# Patient Record
Sex: Male | Born: 1940 | Race: White | Hispanic: No | Marital: Married | Smoking: Former smoker
Health system: Southern US, Community
[De-identification: ages and names within clinical notes are randomized; demographics above are authoritative.]

## PROBLEM LIST (undated history)

## (undated) DIAGNOSIS — I4891 Unspecified atrial fibrillation: Principal | ICD-10-CM

## (undated) DIAGNOSIS — F0390 Unspecified dementia without behavioral disturbance: Secondary | ICD-10-CM

## (undated) DIAGNOSIS — G3184 Mild cognitive impairment, so stated: Secondary | ICD-10-CM

## (undated) DIAGNOSIS — E7800 Pure hypercholesterolemia, unspecified: Secondary | ICD-10-CM

## (undated) DIAGNOSIS — I10 Essential (primary) hypertension: Secondary | ICD-10-CM

## (undated) DIAGNOSIS — R413 Other amnesia: Secondary | ICD-10-CM

## (undated) HISTORY — DX: Essential (primary) hypertension: I10

## (undated) HISTORY — DX: Pure hypercholesterolemia, unspecified: E78.00

## (undated) HISTORY — PX: KNEE SURGERY: SHX244

## (undated) HISTORY — DX: Unspecified dementia without behavioral disturbance: F03.90

## (undated) HISTORY — DX: Other amnesia: R41.3

## (undated) HISTORY — DX: Mild cognitive impairment, so stated: G31.84

## (undated) HISTORY — DX: Unspecified atrial fibrillation: I48.91

---

## 2003-03-12 ENCOUNTER — Ambulatory Visit (HOSPITAL_BASED_OUTPATIENT_CLINIC_OR_DEPARTMENT_OTHER): Admission: RE | Admit: 2003-03-12 | Discharge: 2003-03-12 | Payer: Self-pay | Admitting: Orthopedic Surgery

## 2003-03-12 ENCOUNTER — Ambulatory Visit (HOSPITAL_COMMUNITY): Admission: RE | Admit: 2003-03-12 | Discharge: 2003-03-12 | Payer: Self-pay | Admitting: Orthopedic Surgery

## 2005-05-04 ENCOUNTER — Inpatient Hospital Stay (HOSPITAL_COMMUNITY): Admission: RE | Admit: 2005-05-04 | Discharge: 2005-05-06 | Payer: Self-pay | Admitting: Orthopedic Surgery

## 2012-10-18 ENCOUNTER — Telehealth: Payer: Self-pay

## 2012-10-18 DIAGNOSIS — R413 Other amnesia: Secondary | ICD-10-CM

## 2012-10-18 MED ORDER — MEMANTINE HCL ER 28 MG PO CP24
28.0000 mg | ORAL_CAPSULE | Freq: Every day | ORAL | Status: DC
Start: 2012-10-18 — End: 2012-12-07

## 2012-10-18 NOTE — Telephone Encounter (Signed)
Darren Morgan called and left a message saying they got a letter from their insurance stating [name] 10mg  would no longer be available.  They would like to change to the 28mg  XR version once daily.  Rx has been updated (we have been advised 10mg  BID is equal to 28mg  XR daily).  Forwarding to provider for signature.

## 2012-11-02 ENCOUNTER — Other Ambulatory Visit: Payer: Self-pay | Admitting: Neurology

## 2012-12-06 ENCOUNTER — Encounter: Payer: Self-pay | Admitting: Neurology

## 2012-12-07 ENCOUNTER — Ambulatory Visit (INDEPENDENT_AMBULATORY_CARE_PROVIDER_SITE_OTHER): Payer: Medicare Other | Admitting: Neurology

## 2012-12-07 ENCOUNTER — Encounter: Payer: Self-pay | Admitting: Neurology

## 2012-12-07 VITALS — BP 160/89 | HR 58 | Temp 97.9°F | Resp 15 | Ht 70.5 in | Wt 225.0 lb

## 2012-12-07 DIAGNOSIS — F0390 Unspecified dementia without behavioral disturbance: Secondary | ICD-10-CM

## 2012-12-07 DIAGNOSIS — R413 Other amnesia: Secondary | ICD-10-CM

## 2012-12-07 HISTORY — DX: Unspecified dementia, unspecified severity, without behavioral disturbance, psychotic disturbance, mood disturbance, and anxiety: F03.90

## 2012-12-07 MED ORDER — DONEPEZIL HCL 10 MG PO TABS: 10.0000 mg | ORAL_TABLET | Freq: Every day | ORAL | Status: DC

## 2012-12-07 MED ORDER — MEMANTINE HCL ER 28 MG PO CP24: 28.0000 mg | ORAL_CAPSULE | Freq: Every day | ORAL | Status: DC

## 2012-12-07 NOTE — Progress Notes (Addendum)
[facility]  Provider:  Dr Darren Morgan Referring Provider: No ref. provider found Primary Care Physician:  Darren Saupe, MD  Chief Complaint  Patient presents with  . Follow-up    memory loss, rm 10,MMSE:19/30,AFT:15    HPI:  Darren Morgan is a 72 y.o. male here as a referral from Dr. Cornell Morgan, here in a revisit for  memory loss, presumed  early dementia.   [name] , a 72 year old Caucasian right-handed gentleman presents today for revisit in the  presence of his wife. The patient scored on an MMSE  Test in March 2013 21/30 points , but was unable to spell backwards,  he did the calculations.  AFT was 18 points at the time and clock drawing 4/ 4 points in 2013 he changed to [name] 10 mg once a day and had in addition taken [name], now the  MMSE was 24 of 30.  The patient continues to use word finding games, suduko and reads books at home for brain  exercise but he likes mainly to watch TV. He does walk for exercise.  He has a push mower to cut the grass , he is not computer literate at this point and has not made any attempts to play with luminosity or other brain stimulating games.  His fall risk assessment is 2 point  today he MMSE scored only  19/30 points , animal fluency is 15 points, clock was not performed ,  the patient  lost points in the backwards spelling and in the word recall ( missed 2 of 3 words) ,he could not do the calculations but couldn't spell  either. Was unsure about the day of the week ,complete date, unsure even  of the season.  The geriatric depression scale was endorsed at only 1 point.    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   [name] in 2005 under Dr Darren Morgan  was negative for vascular dementia.  His wife reports that they have been a repeat imaging study, at nighttime I altered medical record system nor Epic has any evidence of these. It was on and 5 that was very mild  atrophy described which may mean by the house progressed. I would like to repeat an [name] without contrast. Labs were unremarkable, last drawn here at 09-10-11 normal TSH, homocysteine, RPR negative, vitamin B12 , normal blood cell count. He has since had laboratory testing in May of 2014 with Dr. Jillyn Hidden.      Review of Systems: no hallucinations , no sleep disturbance, no change in appetite. Out of a complete 14 system review, the patient complains of only the following symptoms, and all other reviewed systems are negative.  This patient has been followed in our office since November 2004 by   Dr Darren Morgan ,  in 2013 [name] came for visit that  his wife  initiated after she became concerned about hisr rapid cognitive decline.  The couple had retired, spend  more time together,and daily activities and observations  were more shared than before.  Her husband continued to repeat himself in conversation and seemed to have undergone some personality change.  At the time he left tremor or cogwheeling , but he had a decreased facial expression and also was not as fluent as his work output than he used to be. He had no weakness ,no falls, no spasticity.   Denied any visual difficulties he has been  treated for hypertension and hypercholesterolemia by his primary care physician and was placed on sertraline under the assumption that he is depressed in 2010, now  discontinued.  His Mini-Mental Status Examinations  were performed serially and show a decline since March 2013 .  History   Social History  . Marital Status: Single    Spouse Name: N/A    Number of Children: 1  . Years of Education: N/A   Occupational History  . RETIRED    Social History Main Topics  . Smoking status: Former Games developer  . Smokeless tobacco: Not on file  . Alcohol Use: No  . Drug Use: No  . Sexually Active: Not on file   Other Topics Concern  . Not on file   Social History Narrative   [name], right handed .  retired. Lives at home with his wife of 32 years, Darren Morgan. They have one grown child. Has three sodas a day. Denies alcohol, tobacco and drug use. pt consumes tea ,diet cola once a day.    Family History  Problem Relation Age of Onset  . Alzheimer's disease Mother   . Alzheimer's disease Maternal Aunt     Past Medical History  Diagnosis Date  . Memory loss   . Hypertension   . High cholesterol   . Dementia   . Dementia 12/07/2012    Past Surgical History  Procedure Laterality Date  . Knee surgery Right     KNEE CAP REPLACED    Current Outpatient Prescriptions  Medication Sig Dispense Refill  . aspirin 81 MG tablet Take 81 mg by mouth daily.      Darren Morgan donepezil ([name]) 10 MG tablet Take 1 tablet (10 mg total) by mouth daily.  90 tablet  3  . lisinopril (PRINIVIL,ZESTRIL) 10 MG tablet Take 10 mg by mouth daily.      . [name] ([name] XR) 28 MG CP24 Take 28 mg by mouth daily.  90 capsule  3  . Multiple Vitamins-Minerals (CENTRUM SILVER PO) Take by mouth daily.       No current facility-administered medications for this visit.    Allergies as of 12/07/2012  . (No Known Allergies)    Vitals: BP 160/89  Pulse 58  Temp(Src) 97.9 F (36.6 C) (Oral)  Resp 15  Ht 5' 10.5" (1.791 m)  Wt 225 lb (102.059 kg)  BMI 31.82 kg/m2 Last Weight:  Wt Readings from Last 1 Encounters:  12/07/12 225 lb (102.059 kg)   Last Height:   Ht Readings from Last 1 Encounters:  12/07/12 5' 10.5" (1.791 m)     Physical exam:  General: The patient is awake, alert and appears not in acute distress. The patient is well groomed. Head: Normocephalic, atraumatic. Neck is supple. Mallampati 3 , neck circumference 14.5 inches . Cardiovascular:  irregular rate and rhythm 54 =58, without  murmurs or carotid bruit, and without distended neck veins. Respiratory: Lungs are clear to auscultation. Skin:  Without evidence of edema, or rash Trunk: BMI is elevated , patient  has normal  posture.  Neurologic exam : The patient is awake and alert, oriented to place and time.  Memory subjective  described as " intact' he is very passive and docile, he only answers closed questions.  There is a normal attention span & concentration ability. Speech is non fluent without dysarthria, mild  Dysphonia. Mood and affect : he appears not to realize that he is the patient.   Cranial nerves: Pupils are equal and  briskly reactive to light. Funduscopic exam without  evidence of pallor or edema. Extraocular movements  in vertical and horizontal planes intact and without nystagmus. Visual fields by finger perimetry are intact. Hearing to finger rub intact.  Facial sensation intact to fine touch. Facial motor strength is symmetric and tongue and uvula move midline.  Motor exam: Normal tone and normal muscle bulk and symmetric normal strength in all extremities.  Sensory:  Fine touch, pinprick and vibration were tested in all extremities. Proprioception is tested in the upper extremities only. This was  normal.  Coordination: Rapid alternating movements in the fingers/hands is tested and normal. Finger-to-nose maneuver tested and normal without evidence of ataxia, dysmetria or tremor.  Gait and station: Patient walks without assistive device , he rises with crossing  his arms in front. Up and go . Strength within normal limits. Stance is stable and normal. Tandem gait is impossible , he is wider based for stability, attributed to his "bad' knee.  Unfragmented gait over 50 yards.   Romberg testing is normal.  Deep tendon reflexes: in the  upper and lower extremities are symmetric and intact. Babinski maneuver downgoing.   Assessment:  After physical and neurologic examination, review of laboratory studies, imaging, neurophysiology testing and pre-existing records, assessment will be reviewed on the problem list.    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 Driving restrictions were dicussed again, his wife will  drive for him and occ. on Saturday/ Sunday lets him drive to church , grocery store, but is present in the car .  She reported that he " forgot the driving limitations"  and we needed to discuss them again in detail. It may be easier for him just not to drive at all.   Labs from dr Jillyn Hidden, please.  [name] repeat  In the next 6 month for RV - brain without contrast .  EEG repeat in the next 6 month , comparing to normal study from March 2013.

## 2012-12-07 NOTE — Patient Instructions (Signed)
Driving and Equipment Restrictions °Some medical problems make it dangerous to drive, ride a bike, or use machines. Some of these problems are: °· A hard blow to the head (concussion). °· Passing out (fainting). °· Twitching and shaking (seizures). °· Low blood sugar. °· Taking medicine to help you relax (sedatives). °· Taking pain medicines. °· Wearing an eye patch. °· Wearing splints. This can make it hard to use parts of your body that you need to drive safely. °HOME CARE  °· Do not drive until your doctor says it is okay. °· Do not use machines until your doctor says it is okay. °You may need a form signed by your doctor (medical release) before you can drive again. You may also need this form before you do other tasks where you need to be fully alert. °MAKE SURE YOU: °· Understand these instructions. °· Will watch your condition. °· Will get help right away if you are not doing well or get worse. °Document Released: 07/09/2004 Document Revised: 08/24/2011 Document Reviewed: 10/09/2009 °ExitCare® Patient Information ©2014 [employer]. ° °

## 2012-12-07 NOTE — Addendum Note (Signed)
Addended by: Melvyn Novas on: 12/07/2012 09:56 AM   Modules accepted: Orders

## 2013-05-01 ENCOUNTER — Ambulatory Visit (INDEPENDENT_AMBULATORY_CARE_PROVIDER_SITE_OTHER): Payer: Medicare Other

## 2013-05-01 DIAGNOSIS — R413 Other amnesia: Secondary | ICD-10-CM

## 2013-05-01 NOTE — Procedures (Signed)
    History:  Darren Morgan is a 72 year old gentleman with a history of a progressive memory disturbance. The patient is having some word finding issues, change in functional level. The patient being evaluated for the above.  This is a routine EEG. No skull defects are noted. Medications include [name], lisinopril, [name], and multivitamins.   EEG classification: Normal awake  Description of the recording: The background rhythms of this recording consists of a fairly well modulated medium amplitude alpha rhythm of 8 Hz that is reactive to eye opening and closure. As the record progresses, the patient appears to remain in the waking state throughout the recording. Photic stimulation was performed, resulting in a bilateral and symmetric photic driving response. Hyperventilation was also performed, resulting in a minimal buildup of the background rhythm activities without significant slowing seen. At no time during the recording does there appear to be evidence of spike or spike wave discharges or evidence of focal slowing. EKG monitor shows no evidence of cardiac rhythm abnormalities with a heart rate of 56.  Impression: This is a normal EEG recording in the waking state. No evidence of ictal or interictal discharges are seen.

## 2013-06-13 ENCOUNTER — Ambulatory Visit: Payer: Medicare Other | Admitting: Neurology

## 2013-06-20 ENCOUNTER — Ambulatory Visit (INDEPENDENT_AMBULATORY_CARE_PROVIDER_SITE_OTHER): Payer: Medicare Other | Admitting: Neurology

## 2013-06-20 ENCOUNTER — Encounter (INDEPENDENT_AMBULATORY_CARE_PROVIDER_SITE_OTHER): Payer: Self-pay

## 2013-06-20 ENCOUNTER — Encounter: Payer: Self-pay | Admitting: Neurology

## 2013-06-20 VITALS — BP 169/71 | HR 60 | Ht 71.0 in | Wt 219.0 lb

## 2013-06-20 DIAGNOSIS — F0390 Unspecified dementia without behavioral disturbance: Secondary | ICD-10-CM

## 2013-06-20 DIAGNOSIS — G309 Alzheimer's disease, unspecified: Principal | ICD-10-CM

## 2013-06-20 DIAGNOSIS — G3184 Mild cognitive impairment, so stated: Secondary | ICD-10-CM

## 2013-06-20 DIAGNOSIS — F0280 Dementia in other diseases classified elsewhere without behavioral disturbance: Secondary | ICD-10-CM

## 2013-06-20 DIAGNOSIS — R413 Other amnesia: Secondary | ICD-10-CM

## 2013-06-20 HISTORY — DX: Mild cognitive impairment of uncertain or unknown etiology: G31.84

## 2013-06-20 MED ORDER — MEMANTINE HCL ER 28 MG PO CP24: 28.0000 mg | ORAL_CAPSULE | Freq: Every day | ORAL | Status: DC

## 2013-06-20 MED ORDER — SILDENAFIL CITRATE 25 MG PO TABS: 25.0000 mg | ORAL_TABLET | Freq: Every day | ORAL | Status: DC | PRN

## 2013-06-20 MED ORDER — DONEPEZIL HCL 23 MG PO TABS: 23.0000 mg | ORAL_TABLET | Freq: Every day | ORAL | Status: DC

## 2013-06-20 NOTE — Progress Notes (Signed)
Darren Morgan  Provider:  Dr Vickey Morgan Referring Provider: Cain Saupe, MD Primary Care Physician:  Darren Saupe, MD  Chief Complaint  Patient presents with  . Follow-up    6 mo, Rm 11    HPI:  Darren Morgan is a 73 y.o. male here as a referral from Darren. Cornell Barman, here in a revisit for memory loss, presumed  early dementia.    Interval history : Darren Morgan , a 73 year old Caucasian right-handed gentleman presents today for revisit to test memory in the presence of his wife. Darren Morgan has been referred for a memory evaluation and be repeated MMSE test today. His last visit  in 2012 showed 19 points on MMSE , score and 15 points on the animal fluency test.  In March 2013 he had an MMSE of  21 of 30 points and in his last visit from June 2014 ,  MMSE scored 24/30 points.  Today's MMSE shows 20 of 30 points- a stable finding. [name] is most of his points in the recall of 3 words but was able to spell backwards 3 of the 5 letters - an attention and calculation alternative was not given.  The clock drawing test was performed with 4/4 points and the animal fluency test at 8 points.   The patient is usually quiet and not talkative, has never been. There is no reported change in personaliy or character. He is not longer driving.  [name] 2 points, He is willing to change to aricept 23 mg after the 10 mg dose is completed. The patient continues to use word finding games, suduko and reads books at home for brain exercise,  but he likes mainly to watch TV.  He does walk daily for exercise, has to be remainded.  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   [name] in 2005 under Darren Thad Ranger  was negative for vascular dementia. Darren Morgan husband continued to repeat  himself in conversation, repeatedly asked the same question.  His mother had dementia and seemed to have undergone some personality change.  At the time the patient developed a decreased facial expression and also was not as fluent as his work output than he used to be.  He had no weakness ,no falls, no spasticity. And today no tremor. No movement restriction. No EOM abnormality.  Fall risk remains at 2 points. Erectile dysfunction has become evident to his wife, but no other dysautonomic symptoms wer endorsed , such as dizziness, neuropathy, abnormal sweating or Orthostasis.    Last Visit note:   The patient scored on an MMSE - Test in March 2013 21/30 points , but was unable to spell backwards, he did the calculations.  AFT was 18 points at the time and clock drawing 4/ 4 points. He changed to [name] 10 mg once a day and had in addition taken [name], now the  MMSE was 24 of 30. His fall risk assessment is 2 point  MMSE scored only  19/30 points , animal fluency is 15 points, clock was not performed ,  the patient  lost points in the backwards spelling and in the word recall ( missed 2 of 3 words) ,he could not do the calculations but couldn't spell .His  wife reports that they have been a repeat imaging study, at nighttime I altered medical record system nor Epic has any evidence of these. It was on and 5 that was very mild atrophy described which may mean by the house progressed. I would like to repeat an [name] without contrast. Labs were unremarkable, last drawn here at 09-10-11 normal TSH, homocysteine, RPR negative, vitamin B12 , normal blood cell count. He has since had laboratory testing in May of 2014 with Darren. Jillyn Hidden.      Review of Systems: no hallucinations , no sleep disturbance, no change in appetite. Out of a complete 14 system review, the patient complains of only the following symptoms, and all other reviewed systems are negative.  This patient has been followed in our office since  November 2004 by Darren Thad Ranger ,  in 2013 Mr. Savannah came for visit that  his wife  initiated after she became concerned about hisr rapid cognitive decline.  The couple had retired, spend  more time together,and daily activities and observations  were more shared than before.   Denied any visual difficulties. No REM BD.   He has been treated for hypertension and hypercholesterolemia by his primary care physician and was placed on sertraline under the assumption that he is depressed in 2010, now discontinued.  His Mini-Mental Status Examinations  were performed serially and show a decline since March 2013 .  History   Social History  . Marital Status: Married    Spouse Name: joan    Number of Children: 1  . Years of Education: college   Occupational History  . RETIRED    Social History Main Topics  . Smoking status: Former Games developer  . Smokeless tobacco: Not on file  . Alcohol Use: No  . Drug Use: No  . Sexual Activity: Not on file   Other Topics Concern  . Not on file   Social History Narrative   [name], right handed . retired. Lives at home with his wife of 32 years, Darren Morgan. They have one grown child. Has three sodas a day. Denies alcohol, tobacco and drug use. pt consumes tea ,diet cola once a day.    Family History  Problem Relation Age of Onset  . Alzheimer's disease Mother   . Alzheimer's disease Maternal Aunt     Past Medical History  Diagnosis Date  . Memory loss   . Hypertension   . High cholesterol   . Dementia   . Dementia 12/07/2012    Past Surgical History  Procedure Laterality Date  . Knee surgery Right     KNEE CAP REPLACED    Current Outpatient Prescriptions  Medication Sig Dispense Refill  . aspirin 81 MG tablet Take 81 mg by mouth daily.      Marland Kitchen donepezil ([name]) 10 MG tablet Take 1 tablet (10 mg total) by mouth daily.  90 tablet  3  . lisinopril (PRINIVIL,ZESTRIL) 20 MG tablet Take 20 mg by mouth daily.      . [name] ([name] XR)  28 MG CP24 Take 28 mg by mouth daily.  90 capsule  3  . Multiple Vitamins-Minerals (CENTRUM SILVER PO) Take by mouth daily.       No current facility-administered medications for this visit.    Allergies as of 06/20/2013  . (No Known Allergies)    Vitals: BP 169/71  Pulse 60  Ht 5\' 11"  (1.803 m)  Wt 219 lb (99.338 kg)  BMI 30.56 kg/m2 Last Weight:  Wt Readings from Last 1 Encounters:  06/20/13 219 lb (99.338 kg)   Last Height:   Ht Readings from Last 1 Encounters:  06/20/13 5\' 11"  (1.803 m)     Physical exam:  General: The patient is awake, alert and appears not in acute distress. The patient is well groomed. Head: Normocephalic, atraumatic. Neck is supple. Mallampati 3 , neck circumference 14.5 inches . Cardiovascular:  irregular rate and rhythm 54 =58, without  murmurs or carotid bruit, and without distended neck veins. Respiratory: Lungs are clear to auscultation. Skin:  Without evidence of edema, or rash Trunk: BMI is elevated , patient  has normal posture.  Neurologic exam : The patient is awake and alert, oriented to place and time.   Memory subjective  described as " intact' he is very passive and docile, he only answers closed questions. His MMSE was 20 out of 30, declined slowly .  Marland Kitchen. Speech is non fluent, sparse - without dysarthria, mild dysphonia. Mood and affect : he appears not to realize that he is the patient.   Cranial nerves: Pupils are equal and briskly reactive to light. Funduscopic exam without  evidence of pallor or edema. Extraocular movements  in vertical and horizontal planes intact and without nystagmus. Visual fields by finger perimetry are intact. Hearing to finger rub intact. Facial sensation intact to fine touch. Facial motor strength is symmetric and tongue and uvula move midline.  Motor exam: Normal tone and normal muscle bulk and symmetric normal strength in all extremities.  Sensory:  Fine touch, pinprick and vibration were  normal.  Coordination: Rapid alternating movements in the fingers/hands is slowed , Finger-to-nose maneuver tested and normal without evidence of ataxia, dysmetria or tremor.  Gait and station: Patient walks without assistive device , he rises without bracing himself.  Strength within normal limits. Stance is stable and normal. Tandem gait is impossible , he is wider based for stability, attributed to his "bad' knee.   Unfragmented gait over 50 yards.  Romberg testing is negative .  Deep tendon reflexes: in the  upper and lower extremities are attenuated - Babinski maneuver downgoing.   Assessment:  After physical and neurologic examination, review of laboratory studies, imaging, neurophysiology testing and pre-existing records, assessment will be reviewed on the problem list. Slowly progressive cognitive decline, rather stable on medications.. Increase aricept to 23 mg daily , keep on [name].   Dementia, early  Stage  In 2011 , now progressed to  moderate.  Driving restrictions were dicussed again, his wife will  drive for him.   [name] repeat ordered . EEG discussed- normal EEG ! RV in the next 6 month, with NP, comparing to MMSE .

## 2013-06-20 NOTE — Patient Instructions (Signed)
Dementia Dementia is a word that is used to describe problems with the brain and how it works. People with dementia have memory loss. They may also have problems with thinking, speaking, or solving problems. It can affect how they act around people, how they do their job, their mood, and their personality. These changes may not show up for a long time. Family or friends may not notice problems in the early part of this disease. HOME CARE The following tips are for the person living with, or caring for, the person with dementia. Make the home safe.  Remove locks on bathroom doors.  Use childproof locks on cabinets where alcohol, cleaning supplies, or chemicals are stored.  Put outlet covers in electrical outlets.  Put in childproof locks to keep doors and windows safe.  Remove stove knobs, or put in safety knobs that shut off on their own.  Lower the temperature on water heaters.  Label medicines. Lock them in a safe place.  Keep knives, lighters, matches, power tools, and guns out of reach or in a safe place.  Remove objects that might break or can hurt the person.  Make sure lighting is good inside and outside.  Put in grab bars if needed.  Use a device that detects falls or other needs for help. Lessen confusion.  Keep familiar objects and people around.  Use night lights or low lit (dim) lights at night.  Label objects or areas.  Use reminders, notes, or directions for daily activities or tasks.  Keep a simple routine that is the same for waking, meals, bathing, dressing, and bedtime.  Create a calm and quiet home.  Put up clocks and calendars.  Keep emergency numbers and the home address near all phones.  Help show the different times of day. Open the curtains during the day to let light in. Speak clearly and directly.  Choose simple words and short sentences.  Use a gentle, calm voice.  Do not interrupt.  If the person has a hard time finding a word to  use, give them the word or thought.  Ask 1 question at a time. Give enough time for the person to answer. Repeat the question if the person does not answer. Do things that lessen restlessness.  Provide a comfortable bed.  Have the same bedtime routine every night.  Have a regular walking and activity schedule.  Lessen naps during the day.  Do not let the person drink a lot of caffeine.  Go to events that are not overwhelming. Eat well and drink fluids.  Lessen distractions during meal times and snacks.  Avoid foods that are too hot or too cold.  Watch how the person chews and swallows. This is to make sure they do not choke. Other  Keep all vision, hearing, dental, and medical visits with the doctor.  Only give medicines as told by the doctor.  Watch the person's driving ability. Do not let the person drive if he or she cannot drive safely.  Use a program that helps find a person if they become missing. You may need to register with this program. GET HELP RIGHT AWAY IF:   A fever of 102 F (38.9 C) develops.  Confusion develops or gets worse.  Sleepiness develops or gets worse.  Staying awake is hard to do.  New behavior problems start like mood swings, aggression, and seeing things that are not there.  Problems with balance, speech, or falling develop.  Problems swallowing develop.  Any   problems of another sickness develop. MAKE SURE YOU:  Understand these instructions.  Will watch his or her condition.  Will get help right away if he or she is not doing well or gets worse. Document Released: 05/14/2008 Document Revised: 08/24/2011 Document Reviewed: 10/27/2010 [employer] Patient Information 2014 [employer].  

## 2013-07-13 ENCOUNTER — Ambulatory Visit
Admission: RE | Admit: 2013-07-13 | Discharge: 2013-07-13 | Disposition: A | Discharge status: Home / Self Care | Payer: Medicare Other | Source: Ambulatory Visit | Attending: Neurology | Admitting: Neurology

## 2013-07-13 DIAGNOSIS — F0390 Unspecified dementia without behavioral disturbance: Secondary | ICD-10-CM

## 2013-09-01 ENCOUNTER — Emergency Department (HOSPITAL_COMMUNITY): Payer: Medicare Other

## 2013-09-01 ENCOUNTER — Emergency Department (HOSPITAL_COMMUNITY)
Admission: EM | Admit: 2013-09-01 | Discharge: 2013-09-01 | Disposition: A | Discharge status: Home / Self Care | Payer: Medicare Other | Attending: Emergency Medicine | Admitting: Emergency Medicine

## 2013-09-01 ENCOUNTER — Encounter (HOSPITAL_COMMUNITY): Payer: Self-pay | Admitting: Emergency Medicine

## 2013-09-01 DIAGNOSIS — Z79899 Other long term (current) drug therapy: Secondary | ICD-10-CM | POA: Insufficient documentation

## 2013-09-01 DIAGNOSIS — R112 Nausea with vomiting, unspecified: Secondary | ICD-10-CM | POA: Insufficient documentation

## 2013-09-01 DIAGNOSIS — F0390 Unspecified dementia without behavioral disturbance: Secondary | ICD-10-CM | POA: Insufficient documentation

## 2013-09-01 DIAGNOSIS — E876 Hypokalemia: Secondary | ICD-10-CM | POA: Insufficient documentation

## 2013-09-01 DIAGNOSIS — Z7982 Long term (current) use of aspirin: Secondary | ICD-10-CM | POA: Insufficient documentation

## 2013-09-01 DIAGNOSIS — Z87891 Personal history of nicotine dependence: Secondary | ICD-10-CM | POA: Insufficient documentation

## 2013-09-01 DIAGNOSIS — R61 Generalized hyperhidrosis: Secondary | ICD-10-CM | POA: Insufficient documentation

## 2013-09-01 DIAGNOSIS — I10 Essential (primary) hypertension: Secondary | ICD-10-CM | POA: Insufficient documentation

## 2013-09-01 DIAGNOSIS — I4891 Unspecified atrial fibrillation: Secondary | ICD-10-CM | POA: Insufficient documentation

## 2013-09-01 DIAGNOSIS — Z8669 Personal history of other diseases of the nervous system and sense organs: Secondary | ICD-10-CM | POA: Insufficient documentation

## 2013-09-01 DIAGNOSIS — R42 Dizziness and giddiness: Secondary | ICD-10-CM | POA: Insufficient documentation

## 2013-09-01 LAB — COMPREHENSIVE METABOLIC PANEL
ALT: 11 U/L (ref 0–53)
AST: 19 U/L (ref 0–37)
Albumin: 4 g/dL (ref 3.5–5.2)
Alkaline Phosphatase: 88 U/L (ref 39–117)
BUN: 16 mg/dL (ref 6–23)
CO2: 24 mEq/L (ref 19–32)
Calcium: 9.6 mg/dL (ref 8.4–10.5)
Chloride: 96 mEq/L (ref 96–112)
Creatinine, Ser: 1.28 mg/dL (ref 0.50–1.35)
GFR calc Af Amer: 63 mL/min — ABNORMAL LOW (ref >90)
GFR calc non Af Amer: 55 mL/min — ABNORMAL LOW (ref >90)
Glucose, Bld: 189 mg/dL — ABNORMAL HIGH (ref 70–99)
Potassium: 2.7 mEq/L — CL (ref 3.7–5.3)
Sodium: 140 mEq/L (ref 137–147)
Total Bilirubin: 0.7 mg/dL (ref 0.3–1.2)
Total Protein: 7.5 g/dL (ref 6.0–8.3)

## 2013-09-01 LAB — CBC WITH DIFFERENTIAL/PLATELET
Basophils Absolute: 0 10*3/uL (ref 0.0–0.1)
Basophils Relative: 0 % (ref 0–1)
Eosinophils Absolute: 0.1 10*3/uL (ref 0.0–0.7)
Eosinophils Relative: 1 % (ref 0–5)
HCT: 50 % (ref 39.0–52.0)
HEMOGLOBIN: 17.7 g/dL — AB (ref 13.0–17.0)
Lymphocytes Relative: 15 % (ref 12–46)
Lymphs Abs: 1.6 10*3/uL (ref 0.7–4.0)
MCH: 32.2 pg (ref 26.0–34.0)
MCHC: 35.4 g/dL (ref 30.0–36.0)
MCV: 91.1 fL (ref 78.0–100.0)
Monocytes Absolute: 0.7 10*3/uL (ref 0.1–1.0)
Monocytes Relative: 6 % (ref 3–12)
NEUTROS PCT: 78 % — AB (ref 43–77)
Neutro Abs: 8.2 10*3/uL — ABNORMAL HIGH (ref 1.7–7.7)
Platelets: 229 10*3/uL (ref 150–400)
RBC: 5.49 MIL/uL (ref 4.22–5.81)
RDW: 13.3 % (ref 11.5–15.5)
WBC: 10.5 10*3/uL (ref 4.0–10.5)

## 2013-09-01 LAB — LIPASE, BLOOD: LIPASE: 44 U/L (ref 11–59)

## 2013-09-01 LAB — URINALYSIS, ROUTINE W REFLEX MICROSCOPIC
Glucose, UA: 100 mg/dL — AB
Hgb urine dipstick: NEGATIVE
Ketones, ur: 15 mg/dL — AB
Leukocytes, UA: NEGATIVE
Nitrite: NEGATIVE
Protein, ur: 30 mg/dL — AB
Specific Gravity, Urine: 1.017 (ref 1.005–1.030)
UROBILINOGEN UA: 0.2 mg/dL (ref 0.0–1.0)
pH: 6.5 (ref 5.0–8.0)

## 2013-09-01 LAB — TROPONIN I: Troponin I: <0.3 ng/mL (ref <0.30)

## 2013-09-01 LAB — URINE MICROSCOPIC-ADD ON

## 2013-09-01 MED ORDER — POTASSIUM CHLORIDE CRYS ER 20 MEQ PO TBCR
40.0000 meq | EXTENDED_RELEASE_TABLET | Freq: Once | ORAL | Status: AC
  Administered 2013-09-01: 40 meq via ORAL
  Filled 2013-09-01: qty 2

## 2013-09-01 MED ORDER — POTASSIUM CHLORIDE ER 10 MEQ PO TBCR: 10.0000 meq | EXTENDED_RELEASE_TABLET | Freq: Two times a day (BID) | ORAL | Status: DC

## 2013-09-01 MED ORDER — POTASSIUM CHLORIDE 10 MEQ/100ML IV SOLN
10.0000 meq | INTRAVENOUS | Status: AC
  Administered 2013-09-01 (×2): 10 meq via INTRAVENOUS
  Filled 2013-09-01 (×2): qty 100

## 2013-09-01 MED ORDER — ONDANSETRON 8 MG PO TBDP: 8.0000 mg | ORAL_TABLET | Freq: Three times a day (TID) | ORAL | Status: DC | PRN

## 2013-09-01 NOTE — ED Notes (Signed)
Gave pt Dt. Coke and graham crackers to eat and drink per Dr. Sharman Cheektter's instructions

## 2013-09-01 NOTE — Discharge Instructions (Signed)
Please take medications as prescribed.  Stick to a bland diet until feeling better.  Please followup with your primary care doctor for recheck early next week.  Also, contact cardiology clinic for further evaluation of your atrial fibrillation.   Atrial Fibrillation Atrial fibrillation is a condition that causes your heart to beat irregularly. It may also cause your heart to beat faster than normal. Atrial fibrillation can prevent your heart from pumping blood normally. It increases your risk of stroke and heart problems. HOME CARE  Take medications as told by your doctor.  Only take medications that your doctor says are safe. Some medications can make the condition worse or happen again.  If blood thinners were prescribed by your doctor, take them exactly as told. Too much can cause bleeding. Too little and you will not have the needed protection against stroke and other problems.  Perform blood tests at home if told by your doctor.  Perform blood tests exactly as told by your doctor.  Do not drink alcohol.  Do not drink beverages with caffeine such as coffee, soda, and some teas.  Maintain a healthy weight.  Do not use diet pills unless your doctor says they are safe. They may make heart problems worse.  Follow diet instructions as told by your doctor.  Exercise regularly as told by your doctor.  Keep all follow-up appointments. GET HELP RIGHT AWAY IF:   You have chest or belly (abdominal) pain.  You feel sick to your stomach (nauseous)  You suddenly have swollen feet and ankles.  You feel dizzy.  You face, arms, or legs feel numb or weak.  There is a change in your vision or speech.  You notice a change in the speed, rhythm, or strength of your heartbeat.  You suddenly begin peeing (urinating) more often.  You get tired more easily when moving or exercising. MAKE SURE YOU:   Understand these instructions.  Will watch your condition.  Will get help right away  if you are not doing well or get worse. Document Released: 03/10/2008 Document Revised: 09/26/2012 Document Reviewed: 07/12/2012 [facility] Patient Information 2014 [place], [place].  Nausea and Vomiting Nausea is a sick feeling that often comes before throwing up (vomiting). Vomiting is a reflex where stomach contents come out of your mouth. Vomiting can cause severe loss of body fluids (dehydration). Children and elderly adults can become dehydrated quickly, especially if they also have diarrhea. Nausea and vomiting are symptoms of a condition or disease. It is important to find the cause of your symptoms. CAUSES   Direct irritation of the stomach lining. This irritation can result from increased acid production (gastroesophageal reflux disease), infection, food poisoning, taking certain medicines (such as nonsteroidal anti-inflammatory drugs), alcohol use, or tobacco use.  Signals from the brain.These signals could be caused by a headache, heat exposure, an inner ear disturbance, increased pressure in the brain from injury, infection, a tumor, or a concussion, pain, emotional stimulus, or metabolic problems.  An obstruction in the gastrointestinal tract (bowel obstruction).  Illnesses such as diabetes, hepatitis, gallbladder problems, appendicitis, kidney problems, cancer, sepsis, atypical symptoms of a heart attack, or eating disorders.  Medical treatments such as chemotherapy and radiation.  Receiving medicine that makes you sleep (general anesthetic) during surgery. DIAGNOSIS Your caregiver may ask for tests to be done if the problems do not improve after a few days. Tests may also be done if symptoms are severe or if the reason for the nausea and vomiting is not clear.  Tests may include:  Urine tests.  Blood tests.  Stool tests.  Cultures (to look for evidence of infection).  X-rays or other imaging studies. Test results can help your caregiver make decisions about treatment or  the need for additional tests. TREATMENT You need to stay well hydrated. Drink frequently but in small amounts.You may wish to drink water, sports drinks, clear broth, or eat frozen ice pops or gelatin dessert to help stay hydrated.When you eat, eating slowly may help prevent nausea.There are also some antinausea medicines that may help prevent nausea. HOME CARE INSTRUCTIONS   Take all medicine as directed by your caregiver.  If you do not have an appetite, do not force yourself to eat. However, you must continue to drink fluids.  If you have an appetite, eat a normal diet unless your caregiver tells you differently.  Eat a variety of complex carbohydrates (rice, wheat, potatoes, bread), lean meats, yogurt, fruits, and vegetables.  Avoid high-fat foods because they are more difficult to digest.  Drink enough water and fluids to keep your urine clear or pale yellow.  If you are dehydrated, ask your caregiver for specific rehydration instructions. Signs of dehydration may include:  Severe thirst.  Dry lips and mouth.  Dizziness.  Dark urine.  Decreasing urine frequency and amount.  Confusion.  Rapid breathing or pulse. SEEK IMMEDIATE MEDICAL CARE IF:   You have blood or brown flecks (like coffee grounds) in your vomit.  You have black or bloody stools.  You have a severe headache or stiff neck.  You are confused.  You have severe abdominal pain.  You have chest pain or trouble breathing.  You do not urinate at least once every 8 hours.  You develop cold or clammy skin.  You continue to vomit for longer than 24 to 48 hours.  You have a fever. MAKE SURE YOU:   Understand these instructions.  Will watch your condition.  Will get help right away if you are not doing well or get worse. Document Released: 06/01/2005 Document Revised: 08/24/2011 Document Reviewed: 10/29/2010 [facility] Patient Information 2014 [place], [place].  Hypokalemia Hypokalemia means  that the amount of potassium in the blood is lower than normal.Potassium is a chemical, called an electrolyte, that helps regulate the amount of fluid in the body. It also stimulates muscle contraction and helps nerves function properly.Most of the body's potassium is inside of cells, and only a very small amount is in the blood. Because the amount in the blood is so small, minor changes can be life-threatening. CAUSES  Antibiotics.  Diarrhea or vomiting.  Using laxatives too much, which can cause diarrhea.  Chronic kidney disease.  Water pills (diuretics).  Eating disorders (bulimia).  Low magnesium level.  Sweating a lot. SIGNS AND SYMPTOMS  Weakness.  Constipation.  Fatigue.  Muscle cramps.  Mental confusion.  Skipped heartbeats or irregular heartbeat (palpitations).  Tingling or numbness. DIAGNOSIS  Your health care provider can diagnose hypokalemia with blood tests. In addition to checking your potassium level, your health care provider may also check other lab tests. TREATMENT Hypokalemia can be treated with potassium supplements taken by mouth or adjustments in your current medicines. If your potassium level is very low, you may need to get potassium through a vein (IV) and be monitored in the hospital. A diet high in potassium is also helpful. Foods high in potassium are:  Nuts, such as peanuts and pistachios.  Seeds, such as sunflower seeds and pumpkin seeds.  Peas, lentils, and  lima beans.  Whole grain and bran cereals and breads.  Fresh fruit and vegetables, such as apricots, avocado, bananas, cantaloupe, kiwi, oranges, tomatoes, asparagus, and potatoes.  Orange and tomato juices.  Red meats.  Fruit yogurt. HOME CARE INSTRUCTIONS  Take all medicines as prescribed by your health care provider.  Maintain a healthy diet by including nutritious food, such as fruits, vegetables, nuts, whole grains, and lean meats.  If you are taking a laxative, be  sure to follow the directions on the label. SEEK MEDICAL CARE IF:  Your weakness gets worse.  You feel your heart pounding or racing.  You are vomiting or having diarrhea.  You are diabetic and having trouble keeping your blood glucose in the normal range. SEEK IMMEDIATE MEDICAL CARE IF:  You have chest pain, shortness of breath, or dizziness.  You are vomiting or having diarrhea for more than 2 days.  You faint. MAKE SURE YOU:   Understand these instructions.  Will watch your condition.  Will get help right away if you are not doing well or get worse. Document Released: 06/01/2005 Document Revised: 03/22/2013 Document Reviewed: 12/02/2012 [facility] Patient Information 2014 [place], [place].  Potassium Content of Foods Potassium is a mineral found in many foods and drinks. It helps keep fluids and minerals balanced in your body and also affects how steadily your heart beats. The body needs potassium to control blood pressure and to keep the muscles and nervous system healthy. However, certain health conditions and medicine may require you to eat more or less potassium-rich foods and drinks. Your caregiver or dietitian will tell you how much potassium you should have each day. COMMON SERVING SIZES The list below tells you how big or small common portion sizes are:  1 oz.........4 stacked dice.  3 oz........Marland KitchenDeck of cards.  1 tsp.......Marland KitchenTip of little finger.  1 tbsp....Marland KitchenMarland KitchenThumb.  2 tbsp....Marland KitchenMarland KitchenGolf ball.   c..........Marland KitchenHalf of a fist.  1 c...........Marland KitchenA fist. FOODS AND DRINKS HIGH IN POTASSIUM More than 200 mg of potassium per serving. A serving size is  c (120 mL or noted gram weight) unless otherwise stated. While all the items on this list are high in potassium, some items are higher in potassium than others. Fruits  Apricots (sliced), 83 g.  Apricots (dried halves), 3 oz / 24 g.  Avocado (cubed),  c / 50 g.  Banana (sliced), 75 g.  Cantaloupe (cubed), 80  g.  Dates (pitted), 5 whole / 35 g.  Figs (dried), 4 whole / 32 g.  Guava, c / 55 g.  Honeydew, 1 wedge / 85 g.  Kiwi (sliced), 90 g.  Nectarine, 1 small / 129 g.  Orange, 1 medium / 131 g.  Orange juice.  Pomegranate seeds, 87 g.  Pomegranate juice.  Prunes (pitted), 3 whole / 30 g.  Prune juice, 3 oz / 90 mL.  Seedless raisins, 3 tbsp / 27 g. Vegetables  Artichoke,  of a medium / 64 g.  Asparagus (boiled), 90 g.  Baked beans,  c / 63 g.  Bamboo shoots,  c / 38 g.  Beets (cooked slices), 85 g.  Broccoli (boiled), 78 g.  Brussels sprout (boiled), 78 g.  Butternut squash (baked), 103 g.  Chickpea (cooked), 82 g.  Green peas (cooked), 80 g.  Hubbard squash (baked cubes),  c / 68 g.  Kidney beans (cooked), 5 tbsp / 55 g.  Lima beans (cooked),  c / 43 g.  Navy beans (cooked),  c / 61 g.  Potato (baked), 61 g.  Potato (boiled), 78 g.  Pumpkin (boiled), 123 g.  Refried beans,  c / 79 g.  Spinach (cooked),  c / 45 g.  Split peas (cooked),  c / 65 g.  Sun-dried tomatoes, 2 tbsp / 7 g.  Sweet potato (baked),  c / 50 g.  Tomato (chopped or sliced), 90 g.  Tomato juice.  Tomato paste, 4 tsp / 21 g.  Tomato sauce,  c / 61 g.  Vegetable juice.  White mushrooms (cooked), 78 g.  Yam (cooked or baked),  c / 34 g.  Zucchini squash (boiled), 90 g. Other Foods and Drinks  Almonds (whole),  c / 36 g.  Cashews (oil roasted),  c / 32 g.  Chocolate milk.  Chocolate pudding, 142 g.  Clams (steamed), 1.5 oz / 43 g.  Dark chocolate, 1.5 oz / 42 g.  Fish, 3 oz / 85 g.  King crab (steamed), 3 oz / 85 g.  Lobster (steamed), 4 oz / 113 g.  Milk (skim, 1%, 2%, whole), 1 c / 240 mL.  Milk chocolate, 2.3 oz / 66 g.  Milk shake.  Nonfat fruit variety yogurt, 123 g.  Peanuts (oil roasted), 1 oz / 28 g.  Peanut butter, 2 tbsp / 32 g.  Pistachio nuts, 1 oz / 28 g.  Pumpkin seeds, 1 oz / 28 g.  Red meat (broiled, cooked,  grilled), 3 oz / 85 g.  Scallops (steamed), 3 oz / 85 g.  Shredded wheat cereal (dry), 3 oblong biscuits / 75 g.  Spaghetti sauce,  c / 66 g.  Sunflower seeds (dry roasted), 1 oz / 28 g.  Veggie burger, 1 patty / 70 g. FOODS MODERATE IN POTASSIUM Between 150 mg and 200 mg per serving. A serving is  c (120 mL or noted gram weight) unless otherwise stated. Fruits  Grapefruit,  of the fruit / 123 g.  Grapefruit juice.  Pineapple juice.  Plums (sliced), 83 g.  Tangerine, 1 large / 120 g. Vegetables  Carrots (boiled), 78 g.  Carrots (sliced), 61 g.  Rhubarb (cooked with sugar), 120 g.  Rutabaga (cooked), 120 g.  Sweet corn (cooked), 75 g.  Yellow snap beans (cooked), 63 g. Other Foods and Drinks   Bagel, 1 bagel / 98 g.  Chicken breast (roasted and chopped),  c / 70 g.  Chocolate ice cream / 66 g.  Pita bread, 1 large / 64 g.  Shrimp (steamed), 4 oz / 113 g.  Swiss cheese (diced), 70 g.  Vanilla ice cream, 66 g.  Vanilla pudding, 140 g. FOODS LOW IN POTASSIUM Less than 150 mg per serving. A serving size is  cup (120 mL or noted gram weight) unless otherwise stated. If you eat more than 1 serving of a food low in potassium, the food may be considered a food high in potassium. Fruits  Apple (slices), 55 g.  Apple juice.  Applesauce, 122 g.  Blackberries, 72 g.  Blueberries, 74 g.  Cranberries, 50 g.  Cranberry juice.  Fruit cocktail, 119 g.  Fruit punch.  Grapes, 46 g.  Grape juice.  Mandarin oranges (canned), 126 g.  Peach (slices), 77 g.  Pineapple (chunks), 83 g.  Raspberries, 62 g.  Red cherries (without pits), 78 g.  Strawberries (sliced), 83 g.  Watermelon (diced), 76 g. Vegetables  Alfalfa sprouts, 17 g.  Bell peppers (sliced), 46 g.  Cabbage (shredded), 35 g.  Cauliflower (boiled), 62 g.  Celery,  51 g.  Collard greens (boiled), 95 g.  Cucumber (sliced), 52 g.  Eggplant (cubed), 41 g.  Green beans  (boiled), 63 g.  Lettuce (shredded), 1 c / 36 g.  Onions (sauteed), 44 g.  Radishes (sliced), 58 g.  Spaghetti squash, 51 g. Other Foods and Drinks  [employer], 1 slice / 28 g.  Black tea.  Brown rice (cooked), 98 g.  Butter croissant, 1 medium / 57 g.  Carbonated soda.  Coffee.  Cheddar cheese (diced), 66 g.  Corn flake cereal (dry), 14 g.  Cottage cheese, 118 g.  Cream of rice cereal (cooked), 122 g.  Cream of wheat cereal (cooked), 126 g.  Crisped rice cereal (dry), 14 g.  Egg (boiled, fried, poached, omelet, scrambled), 1 large / 46 61 g.  English muffin, 1 muffin / 57 g.  Frozen ice pop, 1 pop / 55 g.  Graham cracker, 1 large rectangular cracker / 14 g.  Jelly beans, 112 g.  Non-dairy whipped topping.  Oatmeal, 88 g.  Orange sherbet, 74 g.  Puffed rice cereal (dry), 7 g.  Pasta (cooked), 70 g.  Rice cakes, 4 cakes / 36 g.  Sugared doughnut, 4 oz / 116 g.  White bread, 1 slice / 30 g.  White rice (cooked), 79 93 g.  Wild rice (cooked), 82 g.  Yellow cake, 1 slice / 68 g. Document Released: 01/13/2005 Document Revised: 05/18/2012 Document Reviewed: 10/16/2011 [facility] Patient Information 2014 [place], [place].

## 2013-09-01 NOTE — ED Notes (Signed)
Pt from home, developed N/V and diaphoreses an hour prior to arrival. EMS called. Pt found to be in Afib with no prior history of Afib. 4mg  Zofran given en route to the Er. Pt projectile vomited moderate amount of bile just before arriving to ER. Pt has a dx of alzheimer's. A & O x 4. Denies CP.

## 2013-09-01 NOTE — ED Provider Notes (Addendum)
CSN: [id number]     Arrival date & time 09/01/13  [id number] History   First MD Initiated Contact with Patient 09/01/13 [phone]     Chief Complaint  Patient presents with  . Emesis  . Atrial Fibrillation     (Consider location/radiation/quality/duration/timing/severity/associated sxs/prior Treatment) HPI 73 yo male presents to the ER from home via EMS with complaint of N/V.  Pt with h/o htn, early dementia.  Wife reports a few nights ago he woke her up complaining of feeling unwell and dizzy.  He was able to go back to sleep, and the next day was fine.  Tonight woke at 2 am feeling unwell, was clammy, sweaty, and had multiple episodes of n/v.  Pt was given zofran by EMS, and now is feeling better.  Pt noted to be in controlled afib, no prior h/o same.  Pt denies any palpitations, chest pain, sob.  No abd pain, no diarrhea, no sick contacts.   Past Medical History  Diagnosis Date  . Memory loss   . Hypertension   . High cholesterol   . Dementia   . Dementia 12/07/2012  . 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 Knee surgery Right     KNEE CAP REPLACED   Family History  Problem Relation Age of Onset  . Alzheimer's disease Mother   . Alzheimer's disease Maternal Aunt    History  Substance Use Topics  . Smoking status: Former Games developermoker  . Smokeless tobacco: Not on file  . Alcohol Use: No    Review of Systems  Unable to perform ROS: Dementia      Allergies  Review of patient's allergies indicates no known allergies.  Home Medications   Current Outpatient Rx  Name  Route  Sig  Dispense  Refill  . aspirin 81 MG tablet   Oral   Take 81 mg by mouth daily.         Marland Kitchen. donepezil ([name]) 23 MG TABS tablet   Oral   Take 1 tablet (23 mg total) by mouth at bedtime.   90 tablet   3   . lisinopril (PRINIVIL,ZESTRIL) 20 MG tablet   Oral   Take 20 mg by mouth daily.         . [name] ([name] XR)  28 MG CP24   Oral   Take 28 mg by mouth daily.   90 capsule   3     once a day po  .   Marland Kitchen. Multiple Vitamins-Minerals (CENTRUM SILVER PO)   Oral   Take by mouth daily.         . sildenafil (VIAGRA) 25 MG tablet   Oral   Take 1 tablet (25 mg total) by mouth daily as needed for erectile dysfunction.   10 tablet   0    BP 160/88  Pulse 73  Temp(Src) 97.3 F (36.3 C) (Axillary)  Resp 22  Ht 5\' 11"  (1.803 m)  Wt 230 lb (104.327 kg)  BMI 32.09 kg/m2  SpO2 94% Physical Exam  Nursing note and vitals reviewed. Constitutional: He appears well-developed and well-nourished.  HENT:  Head: Normocephalic and atraumatic.  Right Ear: External ear normal.  Left Ear: External ear normal.  Nose: Nose normal.  Mouth/Throat: Oropharynx is clear and moist.  Eyes: Conjunctivae and EOM are normal. Pupils are equal, round, and reactive to light.  Neck: Normal range of motion. Neck supple. No JVD present. No  tracheal deviation present. No thyromegaly present.  Cardiovascular: Normal heart sounds and intact distal pulses.  Exam reveals no gallop and no friction rub.   No murmur heard. Irregular irregular  Pulmonary/Chest: Effort normal and breath sounds normal. No stridor. No respiratory distress. He has no wheezes. He has no rales. He exhibits no tenderness.  Abdominal: Soft. Bowel sounds are normal. He exhibits no distension and no mass. There is no tenderness. There is no rebound and no guarding.  Musculoskeletal: Normal range of motion. He exhibits no edema and no tenderness.  Lymphadenopathy:    He has no cervical adenopathy.  Neurological: He is alert. He has normal reflexes. No cranial nerve deficit. He exhibits normal muscle tone. Coordination normal.  Oriented to self, in hospital  Skin: Skin is warm and dry. No rash noted. No erythema. No pallor.  Psychiatric: He has a normal mood and affect. His behavior is normal. Judgment and thought content normal.    ED Course  Procedures  (including critical care time) Labs Review Labs Reviewed  CBC WITH DIFFERENTIAL - Abnormal; Notable for the following:    Hemoglobin 17.7 (*)    Neutrophils Relative % 78 (*)    Neutro Abs 8.2 (*)    All other components within normal limits  COMPREHENSIVE METABOLIC PANEL - Abnormal; Notable for the following:    Potassium 2.7 (*)    Glucose, Bld 189 (*)    GFR calc non Af Amer 55 (*)    GFR calc Af Amer 63 (*)    All other components within normal limits  URINALYSIS, ROUTINE W REFLEX MICROSCOPIC - Abnormal; Notable for the following:    Glucose, UA 100 (*)    Bilirubin Urine SMALL (*)    Ketones, ur 15 (*)    Protein, ur 30 (*)    All other components within normal limits  URINE MICROSCOPIC-ADD ON - Abnormal; Notable for the following:    Casts HYALINE CASTS (*)    All other components within normal limits  LIPASE, BLOOD  TROPONIN I   Imaging Review Dg Chest 2 View  09/01/2013   CLINICAL DATA:  Diaphoresis, nausea and vomiting.  EXAM: CHEST  2 VIEW  COMPARISON:  PA and lateral chest 04/29/2007.  FINDINGS: Lungs clear.  Heart size normal.  No pneumothorax or pleural fluid.  IMPRESSION: Negative chest.   Electronically Signed   By: Drusilla Kanner M.D.   On: 09/01/2013 04:35     EKG Interpretation  Date/Time:  Friday September 01 2013 03:41:57 EDT Ventricular Rate:  79 PR Interval:    QRS Duration: 82 QT Interval:  357 QTC Calculation: 409 R Axis:   25 Text Interpretation:  Atrial fibrillation Anteroseptal infarct, old Repol abnrm suggests ischemia, lateral leads new afib Confirmed by Gizel Riedlinger  MD, Harmoni Lucus ([id number]) on 09/01/2013 5:04:38 AM Also confirmed by Sarha Bartelt  MD, Jabarri Stefanelli ([id number])  on 09/01/2013 6:30:42 AM        MDM   Final diagnoses:  Nausea and vomiting  Atrial fibrillation, controlled  Hypokalemia    73 year old male with nausea, vomiting, noted to be in new controlled.  A. fib.  Patient denies any chest pain, no shortness of breath.  Noted to have hypokalemia.  Discussed  briefly with on-call cardiology, with not recommend starting a lipid her at this time.  Aside from aspirin.  Patient feeling much better after Zofran and fluids.  If able to tolerate by mouth challenge, feel that patient would be stable for discharge home to followup with  primary care Dr. and cardiology    Olivia Mackie, MD 09/01/13 [phone]  7:44 AM Pt tolerating crackers and gingerale.  Will plan to d/c home.  Olivia Mackie, MD 09/01/13 [phone]

## 2013-09-01 NOTE — ED Notes (Signed)
Patient transported to X-ray 

## 2013-09-27 ENCOUNTER — Ambulatory Visit (INDEPENDENT_AMBULATORY_CARE_PROVIDER_SITE_OTHER): Payer: Medicare Other | Admitting: Cardiology

## 2013-09-27 ENCOUNTER — Encounter: Payer: Self-pay | Admitting: Cardiology

## 2013-09-27 VITALS — BP 140/78 | HR 72 | Ht 71.0 in | Wt 211.8 lb

## 2013-09-27 DIAGNOSIS — I10 Essential (primary) hypertension: Secondary | ICD-10-CM | POA: Insufficient documentation

## 2013-09-27 DIAGNOSIS — I4891 Unspecified atrial fibrillation: Secondary | ICD-10-CM | POA: Insufficient documentation

## 2013-09-27 HISTORY — DX: Unspecified atrial fibrillation: I48.91

## 2013-09-27 NOTE — Addendum Note (Signed)
Addended by: Meda KlinefelterPUGH, CHERYL JOHNSON D on: 09/27/2013 09:52 AM   Modules accepted: Orders

## 2013-09-27 NOTE — Progress Notes (Signed)
Darren Morgan Date of Birth: 10/14/1940 Medical Record #[id number]  History of Present Illness: [name] is seen at the request of [name] for evaluation of atrial fibrillation. He is a 73 yo WM with history of HTN, hyperlipidemia, and dementia. He was seen in the ED recently with Nausea, vomiting, and dizziness. He was noted to be in Afib with rate of 79 bpm. He denies any SOB, chest pain, or palpitations. No edema or orthopnea. No known history of arrhythmia or other heart disease. Reports having a stress test 3 years ago that was OK. No history of CVA. Is cared for by his wife. He remains active. In the ED his potassium was 2.7. His wife reports this was repeated and was normal.  Current Outpatient Prescriptions on File Prior to Visit  Medication Sig Dispense Refill  . aspirin 81 MG tablet Take 81 mg by mouth daily.      Marland Kitchen. donepezil ([name]) 23 MG TABS tablet Take 1 tablet (23 mg total) by mouth at bedtime.  90 tablet  3  . lisinopril (PRINIVIL,ZESTRIL) 20 MG tablet Take 20 mg by mouth daily.      . [name] ([name] XR) 28 MG CP24 Take 28 mg by mouth daily.  90 capsule  3  . Multiple Vitamins-Minerals (CENTRUM SILVER PO) Take by mouth daily.      . sildenafil (VIAGRA) 25 MG tablet Take 1 tablet (25 mg total) by mouth daily as needed for erectile dysfunction.  10 tablet  0   No current facility-administered medications on file prior to visit.    No Known Allergies  Past Medical History  Diagnosis Date  . Memory loss   . Hypertension   . High cholesterol   . Dementia   . Dementia 12/07/2012  . 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 Atrial fibrillation 09/27/2013    Past Surgical History  Procedure Laterality Date  . Knee surgery Right     KNEE CAP REPLACED    History  Smoking status  . Former Smoker  Smokeless tobacco  . Not on file    History  Alcohol Use No    Family History  Problem Relation Age of Onset  . Alzheimer's  disease Mother   . Alzheimer's disease Maternal Aunt     Review of Systems: The review of systems is positive for dementia with memory loss.  Recent [name] showed atrophy without CVA. All other systems were reviewed and are negative.  Physical Exam: BP 140/78  Pulse 72  Ht 5\' 11"  (1.803 m)  Wt 211 lb 12.8 oz (96.072 kg)  BMI 29.55 kg/m2 Pleasant overweight WM in NAD.  HEENT: [place]/AT,PERRL,EOMI, sclera are clear. Oropharynx is clear.  Neck: no JVD, adenopathy, bruits, or thyromegaly Lungs: clear CV: IRR, normal S1-2, no gallop or murmur. PMI is normal Abd: overweight, soft, NT. BS +. No masses or HSM Ext: no cyanosis or edema. Pulses 2+ Neuro: alert, oriented x 2. CN II-XII, nonfocal Skin: warm and dry  LABORATORY DATA: Lab Results  Component Value Date   WBC 10.5 09/01/2013   HGB 17.7* 09/01/2013   HCT 50.0 09/01/2013   PLT 229 09/01/2013   GLUCOSE 189* 09/01/2013   ALT 11 09/01/2013   AST 19 09/01/2013   NA 140 09/01/2013   K 2.7* 09/01/2013   CL 96 09/01/2013   CREATININE 1.28 09/01/2013   BUN 16 09/01/2013   CO2 24 09/01/2013   Ecg: 09/01/13 atrial fibrillation with rate  79 bpm. Possible old septal MI. Nonspecific ST-T changes  Ecg: today: NSR with PACs. Otherwise normal. ST-T changes resolved.  Assessment / Plan: 1. Atrial fibrillation- now converted to NSR. I suspect this was related to acute stressors of N/V and hypokalemia. CHAD-VASC score of 2. Asymptomatic. Will confirm that TSH has been checked. Will schedule for Echocardiogram. Continue ASA 81 mg now. If he were to have recurrent Afib in the future would recommend long term anticoagulation but since this is an isolated episode related to acute stressors we will hold off on anticoagulation for now. 2. HTN 3. Dementia.

## 2013-09-27 NOTE — Addendum Note (Signed)
Addended by: Meda KlinefelterPUGH, CHERYL JOHNSON D on: 09/27/2013 09:48 AM   Modules accepted: Orders

## 2013-09-27 NOTE — Patient Instructions (Addendum)
We will get the results of your lab work from [name].  We will schedule you for an Echocardiogram

## 2013-10-04 ENCOUNTER — Ambulatory Visit (HOSPITAL_COMMUNITY): Payer: Medicare Other | Attending: Internal Medicine | Admitting: Cardiology

## 2013-10-04 DIAGNOSIS — I4891 Unspecified atrial fibrillation: Secondary | ICD-10-CM | POA: Insufficient documentation

## 2013-10-04 DIAGNOSIS — I10 Essential (primary) hypertension: Secondary | ICD-10-CM

## 2013-10-04 NOTE — Progress Notes (Signed)
Echo performed. 

## 2013-12-20 ENCOUNTER — Encounter (INDEPENDENT_AMBULATORY_CARE_PROVIDER_SITE_OTHER): Payer: Self-pay

## 2013-12-20 ENCOUNTER — Encounter: Payer: Self-pay | Admitting: Nurse Practitioner

## 2013-12-20 ENCOUNTER — Ambulatory Visit (INDEPENDENT_AMBULATORY_CARE_PROVIDER_SITE_OTHER): Payer: Medicare Other | Admitting: Nurse Practitioner

## 2013-12-20 VITALS — BP 178/80 | HR 55 | Ht 71.0 in | Wt 212.4 lb

## 2013-12-20 DIAGNOSIS — F0390 Unspecified dementia without behavioral disturbance: Secondary | ICD-10-CM

## 2013-12-20 NOTE — Progress Notes (Signed)
PATIENT: Darren Morgan DOB: 03/21/1941  REASON FOR VISIT: routine follow up for dementia HISTORY FROM: patient  HISTORY OF PRESENT ILLNESS: Darren Morgan is a 73 y.o. male here as a referral from [name], here in a revisit for memory loss, presumed early dementia.   Interval history : Darren Morgan , a 73 year old Caucasian right-handed gentleman presents today for revisit to test memory in the presence of his wife.  Since last visit the patient's memory has been stable. There have been no behavioral changes or significant illnesses.  He has tolerated increased dose of donepezil well. He also continues to take [name] without problem. MMSE testing today shows a score of 21/30 with deficits in delayed recall 0/3, errors in attention and calculation and orientation to time and place. Animal fluency testing is 12 today. Geriatric depression score 1 only. There are no new complaints today.  06/20/13 (CD):  Darren Morgan has been referred for a memory evaluation and be repeated MMSE test today. His last visit in 2012 showed 19 points on MMSE , score and 15 points on the animal fluency test. In March 2013 he had an MMSE of 21 of 30 points and in his last visit from June 2014 , MMSE scored 24/30 points. Today's MMSE shows 20 of 30 points- a stable finding. [name] is most of his points in the recall of 3 words but was able to spell backwards 3 of the 5 letters - an attention and calculation alternative was not given. The clock drawing test was performed with 4/4 points and the animal fluency test at 8 points. The patient is usually quiet and not talkative, has never been. There is no reported change in personaliy or character. He is not longer driving.  [name] 2 points, He is willing to change to aricept 23 mg after the 10 mg dose is completed. The patient continues to use word finding games, suduko and reads books at home for brain exercise, but he likes mainly to watch TV.  He does walk daily for  exercise, has to be remainded. HAnd e uses a push mower to cut the grass , he is not computer literate at this point and has not made any attempts to play with luminosity or other brain stimulating games.Darren Morgan is a singing at the church choir, which is an important social activity for his wife and him.  [name] in 2005 under [name] was negative for vascular dementia. Her husband continued to repeat himself in conversation, repeatedly asked the same question. His mother had dementia and seemed to have undergone some personality change. At the time the patient developed a decreased facial expression and also was not as fluent as his work output than he used to be.  He had no weakness ,no falls, no spasticity. And today no tremor. No movement restriction. No EOM abnormality. Fall risk remains at 2 points. Erectile dysfunction has become evident to his wife, but no other dysautonomic symptoms wer endorsed , such as dizziness, neuropathy, abnormal sweating or Orthostasis.  Last Visit note:  The patient scored on an MMSE - Test in March 2013 21/30 points , but was unable to spell backwards, he did the calculations. AFT was 18 points at the time and clock drawing 4/ 4 points. He changed to [name] 10 mg once a day and had in addition taken [name], now the MMSE was 24 of 30. His fall risk assessment is 2 point  MMSE scored only 19/30 points ,  animal fluency is 15 points, clock was not performed , the patient lost points in the backwards spelling and in the word recall ( missed 2 of 3 words) ,he could not do the calculations but couldn't spell .His wife reports that they have been a repeat imaging study, at nighttime I altered medical record system nor Epic has any evidence of these. It was on and 5 that was very mild atrophy described which may mean by the house progressed. I would like to repeat an [name] without contrast.  Labs were unremarkable, last drawn here at 09-10-11 normal TSH, homocysteine, RPR negative,  vitamin B12 , normal blood cell count. He has since had laboratory testing in May of 2014 with [name]. Review of Systems: no hallucinations , no sleep disturbance, no change in appetite. Out of a complete 14 system review, the patient complains of only the following symptoms, and all other reviewed systems are negative. This patient has been followed in our office since November 2004 by [name], in 2013 Darren Morgan came for visit that his wife initiated after she became concerned about hisr rapid cognitive decline. The couple had retired, spend more time together,and daily activities and observations were more shared than before. Denied any visual difficulties. No REM BD. He has been treated for hypertension and hypercholesterolemia by his primary care physician and was placed on sertraline under the assumption that he is depressed in 2010, now discontinued. His Mini-Mental Status Examinations were performed serially and show a decline since March 2013.   REVIEW OF SYSTEMS: Full 14 system review of systems performed and notable only for: memory loss  ALLERGIES: No Known Allergies  HOME MEDICATIONS: Outpatient Prescriptions Prior to Visit  Medication Sig Dispense Refill  . aspirin 81 MG tablet Take 81 mg by mouth daily.      Marland Kitchen. donepezil ([name]) 23 MG TABS tablet Take 1 tablet (23 mg total) by mouth at bedtime.  90 tablet  3  . lisinopril (PRINIVIL,ZESTRIL) 20 MG tablet Take 20 mg by mouth daily.      . [name] ([name] XR) 28 MG CP24 Take 28 mg by mouth daily.  90 capsule  3  . Multiple Vitamins-Minerals (CENTRUM SILVER PO) Take by mouth daily.      . sildenafil (VIAGRA) 25 MG tablet Take 1 tablet (25 mg total) by mouth daily as needed for erectile dysfunction.  10 tablet  0   No facility-administered medications prior to visit.    PHYSICAL EXAM Filed Vitals:   12/20/13 0942  BP: 178/80  Pulse: 55  Height: 5\' 11"  (1.803 m)  Weight: 212 lb 6.4 oz (96.344 kg)   Body mass  index is 29.64 kg/(m^2).  Physical exam:  General: The patient is awake, alert and appears not in acute distress. The patient is well groomed.  Head: Normocephalic, atraumatic. Neck is supple. Mallampati 3 , neck circumference 14.5 inches .  Cardiovascular: regular rate and rhythm without murmurs or carotid bruit, and without distended neck veins.  Respiratory: Lungs are clear to auscultation.  Skin: Without evidence of edema, or rash  Trunk: BMI is elevated , patient has normal posture.  Neurologic exam :  The patient is awake and alert, oriented to place and time.  Memory subjective described as " intact' he is very passive and docile, he only answers closed questions. His MMSE was 21 out of 30, last visit was 20/30. Speech is non fluent, sparse - without dysarthria, mild dysphonia. Mood and affect: flat Cranial  nerves:  Pupils are equal and briskly reactive to light. Extraocular movements in vertical and horizontal planes intact and without nystagmus. Visual fields by finger perimetry are intact. Hearing to finger rub intact. Facial sensation intact to fine touch. Facial motor strength is symmetric and tongue and uvula move midline.  Motor exam: Normal tone and normal muscle bulk and symmetric normal strength in all extremities.  Sensory: Fine touch, pinprick and vibration were normal.  Coordination: Rapid alternating movements in the fingers/hands is slowed, Finger-to-nose maneuver tested and normal without evidence of ataxia, dysmetria or tremor.  Gait and station: Patient walks without assistive device, he rises without bracing himself.  Strength within normal limits. Stance is stable and normal. Tandem gait is impossible, he is wider based for stability, attributed to his "bad' knee.  Unfragmented gait over 50 yards. Romberg testing is negative .  Deep tendon reflexes: in the upper and lower extremities are attenuated - Babinski maneuver downgoing.   ASSESSMENT: 73 y.o. 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 here with Dementia, early Stage In 2011, now progressed to moderate with no behavioral disturbance.  Slowly progressive cognitive decline, rather stable on medications.  PLAN: Continue aricept 23 mg daily, Continue [name] XR. Driving restrictions were dicussed again, his wife drives for him.  Continue healthy diet and mentally stimulating activities. RV in 1 year, with Dr. Vickey Hugerohmeier, sooner as needed.  Ronal FearLYNN E. Meryem Haertel, MSN, NP-C 12/20/2013, 10:36 AM [facility] [address], [address] [place] [id number] [phone]  Note: This document was prepared with digital dictation and possible smart phrase technology. Any transcriptional errors that result from this process are unintentional.

## 2013-12-20 NOTE — Progress Notes (Signed)
I agree with the assessment and plan as directed by NP .The patient is known to me .   Klayten Jolliff, MD  

## 2013-12-20 NOTE — Patient Instructions (Addendum)
Continue current Medications.  Memory appears stable at this time.  Follow up visit in 1 year with Dr. Vickey Hugerohmeier.

## 2014-01-10 ENCOUNTER — Encounter: Payer: Self-pay | Admitting: Neurology

## 2014-05-31 ENCOUNTER — Encounter (HOSPITAL_COMMUNITY): Payer: Self-pay | Admitting: *Deleted

## 2014-05-31 ENCOUNTER — Inpatient Hospital Stay (HOSPITAL_COMMUNITY): Payer: Medicare Other

## 2014-05-31 ENCOUNTER — Emergency Department (HOSPITAL_COMMUNITY): Payer: Medicare Other

## 2014-05-31 ENCOUNTER — Inpatient Hospital Stay (HOSPITAL_COMMUNITY)
Admission: EM | Admit: 2014-05-31 | Discharge: 2014-06-02 | DRG: 389 | Disposition: A | Discharge status: Home / Self Care | Payer: Medicare Other | Attending: Internal Medicine | Admitting: Internal Medicine

## 2014-05-31 DIAGNOSIS — G309 Alzheimer's disease, unspecified: Secondary | ICD-10-CM | POA: Diagnosis present

## 2014-05-31 DIAGNOSIS — K5669 Other intestinal obstruction: Secondary | ICD-10-CM | POA: Diagnosis present

## 2014-05-31 DIAGNOSIS — F0280 Dementia in other diseases classified elsewhere without behavioral disturbance: Secondary | ICD-10-CM | POA: Diagnosis present

## 2014-05-31 DIAGNOSIS — A09 Infectious gastroenteritis and colitis, unspecified: Secondary | ICD-10-CM | POA: Diagnosis present

## 2014-05-31 DIAGNOSIS — K529 Noninfective gastroenteritis and colitis, unspecified: Secondary | ICD-10-CM | POA: Diagnosis present

## 2014-05-31 DIAGNOSIS — D72829 Elevated white blood cell count, unspecified: Secondary | ICD-10-CM

## 2014-05-31 DIAGNOSIS — R109 Unspecified abdominal pain: Secondary | ICD-10-CM

## 2014-05-31 DIAGNOSIS — K56609 Unspecified intestinal obstruction, unspecified as to partial versus complete obstruction: Secondary | ICD-10-CM | POA: Diagnosis present

## 2014-05-31 DIAGNOSIS — E785 Hyperlipidemia, unspecified: Secondary | ICD-10-CM | POA: Diagnosis present

## 2014-05-31 DIAGNOSIS — E876 Hypokalemia: Secondary | ICD-10-CM | POA: Diagnosis present

## 2014-05-31 DIAGNOSIS — I10 Essential (primary) hypertension: Secondary | ICD-10-CM | POA: Diagnosis present

## 2014-05-31 DIAGNOSIS — E780 Pure hypercholesterolemia: Secondary | ICD-10-CM | POA: Diagnosis present

## 2014-05-31 DIAGNOSIS — Z87891 Personal history of nicotine dependence: Secondary | ICD-10-CM | POA: Diagnosis not present

## 2014-05-31 DIAGNOSIS — I4891 Unspecified atrial fibrillation: Secondary | ICD-10-CM | POA: Diagnosis present

## 2014-05-31 LAB — COMPREHENSIVE METABOLIC PANEL
ALBUMIN: 4.4 g/dL (ref 3.5–5.2)
ALBUMIN: 3.8 g/dL (ref 3.5–5.2)
ALK PHOS: 93 U/L (ref 39–117)
ALT: 17 U/L (ref 0–53)
ALT: 13 U/L (ref 0–53)
ANION GAP: 18 — AB (ref 5–15)
AST: 29 U/L (ref 0–37)
AST: 27 U/L (ref 0–37)
Alkaline Phosphatase: 89 U/L (ref 39–117)
Anion gap: 16 — ABNORMAL HIGH (ref 5–15)
BUN: 13 mg/dL (ref 6–23)
BUN: 12 mg/dL (ref 6–23)
CALCIUM: 10 mg/dL (ref 8.4–10.5)
CO2: 25 mEq/L (ref 19–32)
CO2: 27 mEq/L (ref 19–32)
Calcium: 9.5 mg/dL (ref 8.4–10.5)
Chloride: 95 mEq/L — ABNORMAL LOW (ref 96–112)
Chloride: 98 mEq/L (ref 96–112)
Creatinine, Ser: 1.15 mg/dL (ref 0.50–1.35)
Creatinine, Ser: 1.07 mg/dL (ref 0.50–1.35)
GFR calc non Af Amer: 62 mL/min — ABNORMAL LOW (ref >90)
GFR calc non Af Amer: 67 mL/min — ABNORMAL LOW (ref >90)
GFR, EST AFRICAN AMERICAN: 72 mL/min — AB (ref >90)
GFR, EST AFRICAN AMERICAN: 78 mL/min — AB (ref >90)
GLUCOSE: 153 mg/dL — AB (ref 70–99)
Glucose, Bld: 156 mg/dL — ABNORMAL HIGH (ref 70–99)
POTASSIUM: 3.3 meq/L — AB (ref 3.7–5.3)
Potassium: 3.7 mEq/L (ref 3.7–5.3)
Sodium: 138 mEq/L (ref 137–147)
Sodium: 141 mEq/L (ref 137–147)
TOTAL PROTEIN: 8.1 g/dL (ref 6.0–8.3)
TOTAL PROTEIN: 7.4 g/dL (ref 6.0–8.3)
Total Bilirubin: 0.7 mg/dL (ref 0.3–1.2)
Total Bilirubin: 0.7 mg/dL (ref 0.3–1.2)

## 2014-05-31 LAB — TROPONIN I

## 2014-05-31 LAB — CBC
HCT: 47.8 % (ref 39.0–52.0)
Hemoglobin: 16.1 g/dL (ref 13.0–17.0)
MCH: 32.5 pg (ref 26.0–34.0)
MCHC: 33.7 g/dL (ref 30.0–36.0)
MCV: 96.4 fL (ref 78.0–100.0)
PLATELETS: 265 10*3/uL (ref 150–400)
RBC: 4.96 MIL/uL (ref 4.22–5.81)
RDW: 13.7 % (ref 11.5–15.5)
WBC: 19.1 10*3/uL — AB (ref 4.0–10.5)

## 2014-05-31 LAB — CBC WITH DIFFERENTIAL/PLATELET
Basophils Absolute: 0 10*3/uL (ref 0.0–0.1)
Basophils Relative: 0 % (ref 0–1)
Eosinophils Absolute: 0 10*3/uL (ref 0.0–0.7)
Eosinophils Relative: 0 % (ref 0–5)
HEMATOCRIT: 46.7 % (ref 39.0–52.0)
Hemoglobin: 15.4 g/dL (ref 13.0–17.0)
LYMPHS PCT: 5 % — AB (ref 12–46)
Lymphs Abs: 0.8 10*3/uL (ref 0.7–4.0)
MCH: 32.2 pg (ref 26.0–34.0)
MCHC: 33 g/dL (ref 30.0–36.0)
MCV: 97.5 fL (ref 78.0–100.0)
MONO ABS: 0.5 10*3/uL (ref 0.1–1.0)
Monocytes Relative: 3 % (ref 3–12)
NEUTROS ABS: 13.8 10*3/uL — AB (ref 1.7–7.7)
Neutrophils Relative %: 92 % — ABNORMAL HIGH (ref 43–77)
Platelets: 250 10*3/uL (ref 150–400)
RBC: 4.79 MIL/uL (ref 4.22–5.81)
RDW: 13.8 % (ref 11.5–15.5)
WBC: 15.1 10*3/uL — AB (ref 4.0–10.5)

## 2014-05-31 LAB — MAGNESIUM: Magnesium: 2.2 mg/dL (ref 1.5–2.5)

## 2014-05-31 LAB — URINALYSIS, ROUTINE W REFLEX MICROSCOPIC
BILIRUBIN URINE: NEGATIVE
Glucose, UA: NEGATIVE mg/dL
Hgb urine dipstick: NEGATIVE
Ketones, ur: 15 mg/dL — AB
Leukocytes, UA: NEGATIVE
NITRITE: NEGATIVE
Protein, ur: 30 mg/dL — AB
SPECIFIC GRAVITY, URINE: 1.014 (ref 1.005–1.030)
UROBILINOGEN UA: 0.2 mg/dL (ref 0.0–1.0)
pH: 7.5 (ref 5.0–8.0)

## 2014-05-31 LAB — PROTIME-INR
INR: 0.96 (ref 0.00–1.49)
Prothrombin Time: 12.9 seconds (ref 11.6–15.2)

## 2014-05-31 LAB — URINE MICROSCOPIC-ADD ON

## 2014-05-31 LAB — APTT: APTT: 23 s — AB (ref 24–37)

## 2014-05-31 LAB — LIPASE, BLOOD: LIPASE: 259 U/L — AB (ref 11–59)

## 2014-05-31 LAB — TSH: TSH: 0.538 u[IU]/mL (ref 0.350–4.500)

## 2014-05-31 LAB — PHOSPHORUS: PHOSPHORUS: 3.2 mg/dL (ref 2.3–4.6)

## 2014-05-31 MED ORDER — SODIUM CHLORIDE 0.9 % IV BOLUS (SEPSIS)
1000.0000 mL | Freq: Once | INTRAVENOUS | Status: AC
  Administered 2014-05-31: 1000 mL via INTRAVENOUS

## 2014-05-31 MED ORDER — SODIUM CHLORIDE 0.9 % IV SOLN
INTRAVENOUS | Status: DC
  Administered 2014-05-31 – 2014-06-01 (×2): via INTRAVENOUS

## 2014-05-31 MED ORDER — HYDRALAZINE HCL 20 MG/ML IJ SOLN
10.0000 mg | INTRAMUSCULAR | Status: DC | PRN
  Administered 2014-05-31: 10 mg via INTRAVENOUS
  Filled 2014-05-31: qty 1

## 2014-05-31 MED ORDER — ONDANSETRON HCL 4 MG PO TABS: 4.0000 mg | ORAL_TABLET | Freq: Four times a day (QID) | ORAL | Status: DC | PRN

## 2014-05-31 MED ORDER — ONDANSETRON HCL 4 MG/2ML IJ SOLN: 4.0000 mg | Freq: Four times a day (QID) | INTRAMUSCULAR | Status: DC | PRN

## 2014-05-31 MED ORDER — SODIUM CHLORIDE 0.9 % IV SOLN
3.0000 g | Freq: Four times a day (QID) | INTRAVENOUS | Status: DC
  Administered 2014-05-31 – 2014-06-02 (×9): 3 g via INTRAVENOUS
  Filled 2014-05-31 (×11): qty 3

## 2014-05-31 MED ORDER — ACETAMINOPHEN 325 MG PO TABS: 650.0000 mg | ORAL_TABLET | Freq: Four times a day (QID) | ORAL | Status: DC | PRN

## 2014-05-31 MED ORDER — SODIUM CHLORIDE 0.9 % IV SOLN
INTRAVENOUS | Status: DC
  Administered 2014-05-31: 05:00:00 via INTRAVENOUS

## 2014-05-31 MED ORDER — IOHEXOL 300 MG/ML  SOLN
100.0000 mL | Freq: Once | INTRAMUSCULAR | Status: AC | PRN
  Administered 2014-05-31: 100 mL via INTRAVENOUS

## 2014-05-31 MED ORDER — CHLORHEXIDINE GLUCONATE 0.12 % MT SOLN
15.0000 mL | Freq: Two times a day (BID) | OROMUCOSAL | Status: DC
  Administered 2014-05-31 – 2014-06-02 (×5): 15 mL via OROMUCOSAL
  Filled 2014-05-31 (×7): qty 15

## 2014-05-31 MED ORDER — CETYLPYRIDINIUM CHLORIDE 0.05 % MT LIQD
7.0000 mL | Freq: Two times a day (BID) | OROMUCOSAL | Status: DC
  Administered 2014-05-31 – 2014-06-02 (×4): 7 mL via OROMUCOSAL

## 2014-05-31 MED ORDER — IOHEXOL 300 MG/ML  SOLN
50.0000 mL | Freq: Once | INTRAMUSCULAR | Status: AC | PRN
  Administered 2014-05-31: 50 mL via ORAL

## 2014-05-31 MED ORDER — ONDANSETRON HCL 4 MG/2ML IJ SOLN
4.0000 mg | Freq: Once | INTRAMUSCULAR | Status: AC
  Administered 2014-05-31: 4 mg via INTRAVENOUS
  Filled 2014-05-31: qty 2

## 2014-05-31 MED ORDER — LORAZEPAM 2 MG/ML IJ SOLN
1.0000 mg | Freq: Three times a day (TID) | INTRAMUSCULAR | Status: DC | PRN
  Administered 2014-05-31: 1 mg via INTRAVENOUS
  Filled 2014-05-31: qty 1

## 2014-05-31 MED ORDER — HYDROMORPHONE HCL 1 MG/ML IJ SOLN: 1.0000 mg | INTRAMUSCULAR | Status: DC | PRN

## 2014-05-31 MED ORDER — HYDROMORPHONE HCL 1 MG/ML IJ SOLN
0.5000 mg | Freq: Once | INTRAMUSCULAR | Status: AC
Start: 2014-05-31 — End: 2014-05-31
  Administered 2014-05-31: 0.5 mg via INTRAVENOUS
  Filled 2014-05-31: qty 1

## 2014-05-31 MED ORDER — SODIUM CHLORIDE 0.9 % IV SOLN
3.0000 g | Freq: Once | INTRAVENOUS | Status: AC
  Administered 2014-05-31: 3 g via INTRAVENOUS
  Filled 2014-05-31: qty 3

## 2014-05-31 MED ORDER — ACETAMINOPHEN 650 MG RE SUPP: 650.0000 mg | Freq: Four times a day (QID) | RECTAL | Status: DC | PRN

## 2014-05-31 MED ORDER — SODIUM CHLORIDE 0.9 % IV SOLN
INTRAVENOUS | Status: AC
  Administered 2014-05-31: 07:00:00 via INTRAVENOUS

## 2014-05-31 MED ORDER — HYDRALAZINE HCL 20 MG/ML IJ SOLN
10.0000 mg | Freq: Four times a day (QID) | INTRAMUSCULAR | Status: DC | PRN
  Administered 2014-05-31 – 2014-06-02 (×2): 10 mg via INTRAVENOUS
  Filled 2014-05-31 (×2): qty 1

## 2014-05-31 NOTE — ED Notes (Addendum)
Pt's wife says he normally pees a lot. He is not able to at the moment.

## 2014-05-31 NOTE — ED Provider Notes (Signed)
CSN: [id number]     Arrival date & time 05/31/14  0054 History   First MD Initiated Contact with Patient 05/31/14 0128     Chief Complaint  Patient presents with  . Shortness of Breath  . Abdominal Pain     (Consider location/radiation/quality/duration/timing/severity/associated sxs/prior Treatment) Patient is a 73 y.o. male presenting with shortness of breath and abdominal pain. The history is provided by the patient and the spouse.  Shortness of Breath Associated symptoms: abdominal pain   Associated symptoms: no chest pain, no cough, no fever, no headaches, no neck pain, no rash and no sore throat   Abdominal Pain Associated symptoms: nausea and shortness of breath   Associated symptoms: no chest pain, no chills, no constipation, no cough, no diarrhea, no dysuria, no fever, no hematuria and no sore throat   pt with hx htn, episode of afib, mild dementia, c/o acute onset left abdominal pain this evening. Pt and spouse ate pizza for dinner.  1- hrs later pt c/o abdominal pain and nausea. No vomiting (spouse w no gi symptoms/ate same food). Pain dull, mod-sever, constant, non radiating, no specific exacerbating or alleviating factors. No hx same pain. No prior abd surgery. Had normal bm today. No dysuria, hematuria or gu c/o. No fever or chills. No back or flank pain. No hx diverticula. No hx aaa. No hx renal stones.      Past Medical History  Diagnosis Date  . Memory loss   . Hypertension   . High cholesterol   . Dementia   . Dementia 12/07/2012  . 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 Atrial fibrillation 09/27/2013   Past Surgical History  Procedure Laterality Date  . Knee surgery Right     KNEE CAP REPLACED   Family History  Problem Relation Age of Onset  . Alzheimer's disease Mother   . Alzheimer's disease Maternal Aunt    History  Substance Use Topics  . Smoking status: Former Games developer  . Smokeless tobacco: Never Used  . Alcohol Use: No     Review of Systems  Constitutional: Negative for fever and chills.  HENT: Negative for sore throat.   Eyes: Negative for redness.  Respiratory: Positive for shortness of breath. Negative for cough.   Cardiovascular: Negative for chest pain.  Gastrointestinal: Positive for nausea and abdominal pain. Negative for diarrhea, constipation, blood in stool and abdominal distention.  Endocrine: Negative for polyuria.  Genitourinary: Negative for dysuria, hematuria and flank pain.  Musculoskeletal: Negative for back pain and neck pain.  Skin: Negative for rash.  Neurological: Negative for headaches.  Hematological: Does not bruise/bleed easily.  Psychiatric/Behavioral: Negative for confusion.      Allergies  Review of patient's allergies indicates no known allergies.  Home Medications   Prior to Admission medications   Medication Sig Start Date End Date Taking? Authorizing Provider  aspirin 81 MG tablet Take 81 mg by mouth daily.    Historical Provider, MD  donepezil ([name]) 23 MG TABS tablet Take 1 tablet (23 mg total) by mouth at bedtime. 06/20/13   Porfirio Mylar Dohmeier, MD  lisinopril (PRINIVIL,ZESTRIL) 20 MG tablet Take 20 mg by mouth daily.    Historical Provider, MD  [name] ([name] XR) 28 MG CP24 Take 28 mg by mouth daily. 06/20/13   Porfirio Mylar Dohmeier, MD  Multiple Vitamins-Minerals (CENTRUM SILVER PO) Take by mouth daily.    Historical Provider, MD  sildenafil (VIAGRA) 25 MG tablet Take 1 tablet (25 mg total) by mouth  daily as needed for erectile dysfunction. 06/20/13   Carmen Dohmeier, MD   BP 185/74 mmHg  Pulse 57  Temp(Src) 98.6 F (37 C) (Oral)  Resp 22  SpO2 100% Physical Exam  Constitutional: He is oriented to person, place, and time. He appears well-developed and well-nourished. No distress.  HENT:  Mouth/Throat: Oropharynx is clear and moist.  Eyes: Conjunctivae are normal. No scleral icterus.  Neck: Neck supple. No tracheal deviation present.  Cardiovascular:  Normal rate, regular rhythm, normal heart sounds and intact distal pulses.   Pulmonary/Chest: Effort normal and breath sounds normal. No accessory muscle usage. No respiratory distress.  Abdominal: Soft. Bowel sounds are normal. He exhibits no distension and no mass. There is tenderness. There is no rebound and no guarding.  Left abdominal tenderness, no rebound or guarding. No puls mass.   Genitourinary:  No cva tenderness  Musculoskeletal: Normal range of motion. He exhibits no edema or tenderness.  Neurological: He is alert and oriented to person, place, and time.  Skin: Skin is warm and dry. No rash noted. He is not diaphoretic.  Psychiatric: He has a normal mood and affect.  Nursing note and vitals reviewed.   ED Course  Procedures (including critical care time) Labs Review  Results for orders placed or performed during the hospital encounter of 05/31/14  CBC     (if pt has PMH of COPD)  Result Value Ref Range   WBC 19.1 (H) 4.0 - 10.5 K/uL   RBC 4.96 4.22 - 5.81 MIL/uL   Hemoglobin 16.1 13.0 - 17.0 g/dL   HCT 16.147.8 09.639.0 - 04.552.0 %   MCV 96.4 78.0 - 100.0 fL   MCH 32.5 26.0 - 34.0 pg   MCHC 33.7 30.0 - 36.0 g/dL   RDW 40.913.7 81.111.5 - 91.415.5 %   Platelets 265 150 - 400 K/uL  Troponin I  Result Value Ref Range   Troponin I <0.30 <0.30 ng/mL  Urinalysis, Routine w reflex microscopic  Result Value Ref Range   Color, Urine YELLOW YELLOW   APPearance CLOUDY (A) CLEAR   Specific Gravity, Urine 1.014 1.005 - 1.030   pH 7.5 5.0 - 8.0   Glucose, UA NEGATIVE NEGATIVE mg/dL   Hgb urine dipstick NEGATIVE NEGATIVE   Bilirubin Urine NEGATIVE NEGATIVE   Ketones, ur 15 (A) NEGATIVE mg/dL   Protein, ur 30 (A) NEGATIVE mg/dL   Urobilinogen, UA 0.2 0.0 - 1.0 mg/dL   Nitrite NEGATIVE NEGATIVE   Leukocytes, UA NEGATIVE NEGATIVE  Comprehensive metabolic panel  Result Value Ref Range   Sodium 138 137 - 147 mEq/L   Potassium 3.3 (L) 3.7 - 5.3 mEq/L   Chloride 95 (L) 96 - 112 mEq/L   CO2 25 19 -  32 mEq/L   Glucose, Bld 156 (H) 70 - 99 mg/dL   BUN 13 6 - 23 mg/dL   Creatinine, Ser 7.821.15 0.50 - 1.35 mg/dL   Calcium 95.610.0 8.4 - 21.310.5 mg/dL   Total Protein 8.1 6.0 - 8.3 g/dL   Albumin 4.4 3.5 - 5.2 g/dL   AST 29 0 - 37 U/L   ALT 17 0 - 53 U/L   Alkaline Phosphatase 93 39 - 117 U/L   Total Bilirubin 0.7 0.3 - 1.2 mg/dL   GFR calc non Af Amer 62 (L) >90 mL/min   GFR calc Af Amer 72 (L) >90 mL/min   Anion gap 18 (H) 5 - 15  Lipase, blood  Result Value Ref Range   Lipase 259 (H)  11 - 59 U/L  Urine microscopic-add on  Result Value Ref Range   Squamous Epithelial / LPF RARE RARE   WBC, UA 0-2 <3 WBC/hpf   Bacteria, UA RARE RARE   Urine-Other AMORPHOUS URATES/PHOSPHATES    Dg Chest 2 View (if Patient Has Fever And/or Copd)  05/31/2014   CLINICAL DATA:  Shortness of breath.  EXAM: CHEST  2 VIEW  COMPARISON:  September 01, 2013.  FINDINGS: The heart size and mediastinal contours are within normal limits. Both lungs are clear. No pneumothorax or pleural effusion is noted. The visualized skeletal structures are unremarkable.  IMPRESSION: No acute cardiopulmonary abnormality seen.   Electronically Signed   By: Roque LiasJames  Green M.D.   On: 05/31/2014 02:11   Ct Abdomen Pelvis W Contrast  05/31/2014   CLINICAL DATA:  Initial valuation for lower abdominal pain since 05/30/2014. Nausea, vomiting. Elevated white blood cell count and lipase.  EXAM: CT ABDOMEN AND PELVIS WITH CONTRAST  TECHNIQUE: Multidetector CT imaging of the abdomen and pelvis was performed using the standard protocol following bolus administration of intravenous contrast.  CONTRAST:  100mL OMNIPAQUE IOHEXOL 300 MG/ML  SOLN  COMPARISON:  None available.  FINDINGS: Subsegmental atelectasis seen dependently within the visualized lung bases. No pleural or pericardial effusion.  The liver demonstrates a normal contrast enhanced appearance. There is trace free perihepatic fluid. Gallbladder within normal limits. No biliary dilatation. Spleen  within normal limits. Trace free perisplenic fluid present. Adrenal glands are unremarkable. Pancreas demonstrates a normal appearance without evidence for acute pancreatitis.  Kidneys are equal size with symmetric enhancement. No nephrolithiasis, hydronephrosis, or focal enhancing renal mass. Subcentimeter hypodense lesion within the right kidney is too small the characterize, but statistically likely represents a small cyst.  Stomach within normal limits.  There are multiple mildly prominent fluid-filled loops of small bowel clustered within the mid and lower right abdomen with associated air-fluid levels. These measure up to 3.1 cm in diameter. There is an apparent transition point within the right abdomen (series 2, image 37). There is thickening and mucosal edema within the ileum at this point. No definite mass lesion identified.  Appendix is normal.  The colon is within normal limits without acute inflammatory changes or evidence of obstruction. Scattered colonic diverticula present without acute diverticulitis.  Bladder within normal limits.  Prostate are unremarkable.  No free intraperitoneal air. No pathologically enlarged intra-abdominal pelvic lymph nodes. Scattered calcified atheromatous plaque present within the intra-abdominal aorta. No aneurysm.  No acute osseous abnormality. No worrisome lytic or blastic osseous lesions. Scatter multilevel degenerative disc disease present within the visualized spine.  IMPRESSION: 1. Findings concerning for small bowel obstruction with transition point in the right abdomen. These findings may be secondary to an acute ileitis with secondary obstruction as there is prominent wall thickening with enhancement in the terminal ileum distal to the transition point. No definite mass lesion or other abnormality to explain mechanical obstruction. Followup to resolution is recommended. 2. Trace free perihepatic and perisplenic fluid, likely reactive due to the underlying  obstructive process. 3. Colonic diverticulosis without acute diverticulitis.   Electronically Signed   By: Rise MuBenjamin  McClintock M.D.   On: 05/31/2014 04:35       EKG Interpretation   Date/Time:  Thursday May 31 2014 01:15:13 EST Ventricular Rate:  52 PR Interval:  194 QRS Duration: 81 QT Interval:  503 QTC Calculation: 468 R Axis:   51 Text Interpretation:  Sinus bradycardia Confirmed by Denton LankSTEINL  MD, Caryn BeeKEVIN  ([id number]) on  05/31/2014 1:29:50 AM      MDM  Iv ns bolus.   Given zofran from triage.  Dilaudid .5 mg iv.  Labs. Ct.  Reviewed nursing notes and prior charts for additional history.   Ct noted. Ng to liws.   Discussed pt, wbc, ct with gen surgery on  Call, Dr Margaree Mackintosh - he indicates would rec bowel rest, ivf, ng, abx, and med service admit - they will do formal consult this morning.  Med service contacted for admission.  Recheck abd soft, mild mid abd tenderness. Pain improved w meds, ng, nausea meds.     Suzi Roots, MD 05/31/14 [phone]

## 2014-05-31 NOTE — ED Notes (Signed)
Pt's wife reports pt c/o sudden onset of lower abd pain with nausea x 4 hours ago.  Started to have SOB upon arrival to the ED.  Pt is cool and clammy.

## 2014-05-31 NOTE — ED Notes (Addendum)
   05/31/14 0358  Oxygen Therapy  SpO2 (!) 86 %  O2 Device Room Air   Pt placed on 1L

## 2014-05-31 NOTE — ED Notes (Signed)
Pt actively vomiting  Zofran given

## 2014-05-31 NOTE — Progress Notes (Signed)
Pt new admission from ED.  Admission history completed and shift assessment completed. Pt denies pain.  BP elevated 180/70. MD called and informed.  Orders given.  Will Marlyne BeardsJennings, PA-C with surgery at bedside now.

## 2014-05-31 NOTE — Progress Notes (Signed)
Called to room by nurse tech.  Pt pulled out NG tube. MD aware.  Pt's brother in law at bedside when it happened.  Orders from MD to attempt reinsertion of NGT after administration of ativan.

## 2014-05-31 NOTE — Progress Notes (Signed)
Pt did not tolerate NG tube replacement even after Ativan administration.  MD paged to make aware.  Wife at bedside.  Pt now calm and not trying to get up out of bed, distracting/redirecting pt from IV tubing and Tele box seems to be effective currently

## 2014-05-31 NOTE — H&P (Signed)
[facility] History and Physical  Darren Morgan NFA:[id number] DOB: 01-20-1941 DOA: 05/31/2014  Referring physician: ER physician PCP: Cain Saupe, MD   Chief Complaint: abdominal pain, nausea and vomiting   HPI:  73 year old male with history of Alzheimer's dementia, hypertension who presented to [facility] ED with complaints of abdominal pain, nausea and vomiting for past 24 hours prior to this admission. Patient is pleasantly confused and not able to provide details of present illness. His family is at the bedside to give more details. Patient apparently ate pizza for dinner and then later complained of mid abdominal pain and nausea. Pt reported vomiting but his wife reported not seeing pt vomit. Patient reported intermittent pain, 5-6/10 in intensity in mid abdomen and  Non radiating. No reports of fevers or chills. No diarrhea or constipation. No other complaints such as chest pain, shortness of breath or palpitations. No GU complaints.  In ED, BP was 152/84, HR 42-64, RR 12-22, oxygen saturation 86 - 100%. NG tube inserted in ED but pt pulled it out once he arrived to the floor unit. RN unable to insert it even with 1 dose of ativan being given to the patient. Blood work showed WBC count 19.1, potassium 3.3 (repleted in ED). CXR showed no acute cardiopulmonary findings.  Abd x ray showed findings concerning for small bowel obstruction. CT abdomen showed SBO with transition point in the right abdomen. These findings may be secondary to an acute ileitis with secondary obstruction as there is prominent wall thickening with enhancement in the terminal ileum distal to the transition point. Followup to resolution is recommended. Surgery has seen the patient in consultation.   Assessment & Plan    Principal Problem: SBO (small bowel obstruction) / Ileitis / Leukocytosis  - Appreciate surgery consult and recommendations - Pt has pulled out NG tube and we were unable to reinsert NG tube again  -  Per surgery, continue to monitor, hopefully ileitis will resolve without surgery - Continue Unasyn started on admission.  - Continue supportive care with IV fluids, analgesia as needed, antiemetics as needed  - Keep NPO  Active Problems; Hypertension - since pt NPO we added hydralazine PRN for blood pressure control   Hypokalemia - due to GI losses - supplemented in ED   DVT prophylaxis:  - SCD's bilaterally   Radiological Exams on Admission: Dg Chest 2 View (if Patient Has Fever And/or Copd) 05/31/2014   No acute cardiopulmonary abnormality seen.   Electronically Signed   By: Roque Lias M.D.   On: 05/31/2014 02:11   Ct Abdomen Pelvis W Contrast 05/31/2014    1. Findings concerning for small bowel obstruction with transition point in the right abdomen. These findings may be secondary to an acute ileitis with secondary obstruction as there is prominent wall thickening with enhancement in the terminal ileum distal to the transition point. No definite mass lesion or other abnormality to explain mechanical obstruction. Followup to resolution is recommended. 2. Trace free perihepatic and perisplenic fluid, likely reactive due to the underlying obstructive process. 3. Colonic diverticulosis without acute diverticulitis.     Dg Abd 2 Views 05/31/2014   Probable small bowel obstruction, unchanged.   Electronically Signed   By: Ulyses Southward M.D.   On: 05/31/2014 11:09    Code Status: Full Family Communication: Plan of care discussed with the patient and his family at the bedside  Disposition Plan: Admit for further evaluation  Manson Passey, MD  [facility] Pager [phone]  Review of Systems:  Constitutional: Negative for fever, chills and malaise/fatigue. Negative for diaphoresis.  HENT: Negative for hearing loss, ear pain, nosebleeds, congestion, sore throat, neck pain, tinnitus and ear discharge.   Eyes: Negative for blurred vision, double vision, photophobia, pain, discharge and  redness.  Respiratory: Negative for cough, hemoptysis, sputum production, shortness of breath, wheezing and stridor.   Cardiovascular: Negative for chest pain, palpitations, orthopnea, claudication and leg swelling.  Gastrointestinal: per HPI.  Genitourinary: Negative for dysuria, urgency, frequency, hematuria and flank pain.  Musculoskeletal: Negative for myalgias, back pain, joint pain and falls.  Skin: Negative for itching and rash.  Neurological: Negative for dizziness and weakness. Negative for tingling, tremors, sensory change, speech change, focal weakness, loss of consciousness and headaches.  Endo/Heme/Allergies: Negative for environmental allergies and polydipsia. Does not bruise/bleed easily.  Psychiatric/Behavioral: Negative for suicidal ideas. The patient is not nervous/anxious.      Past Medical History  Diagnosis Date  . Memory loss   . Hypertension   . High cholesterol   . Dementia   . Dementia 12/07/2012  . 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 Atrial fibrillation 09/27/2013   Past Surgical History  Procedure Laterality Date  . Knee surgery Right     KNEE CAP REPLACED   Social History:  reports that he has quit smoking. He has never used smokeless tobacco. He reports that he does not drink alcohol or use illicit drugs.  No Known Allergies  Family History:  Family History  Problem Relation Age of Onset  . Alzheimer's disease Mother   . Alzheimer's disease Maternal Aunt      Prior to Admission medications   Medication Sig Start Date End Date Taking? Authorizing Provider  aspirin 81 MG tablet Take 81 mg by mouth daily.   Yes Historical Provider, MD  donepezil ([name]) 23 MG TABS tablet Take 1 tablet (23 mg total) by mouth at bedtime. 06/20/13  Yes Carmen Dohmeier, MD  lisinopril (PRINIVIL,ZESTRIL) 10 MG tablet Take 10 mg by mouth daily.   Yes Historical Provider, MD  [name] ([name] XR) 28 MG CP24 Take 28 mg by mouth daily.  06/20/13  Yes Carmen Dohmeier, MD  Multiple Vitamins-Minerals (CENTRUM SILVER PO) Take by mouth daily.   Yes Historical Provider, MD  sildenafil (VIAGRA) 25 MG tablet Take 1 tablet (25 mg total) by mouth daily as needed for erectile dysfunction. Patient not taking: Reported on 05/31/2014 06/20/13   Melvyn Novasarmen Dohmeier, MD   Physical Exam: Filed Vitals:   05/31/14 0856 05/31/14 1057 05/31/14 1619 05/31/14 1736  BP: 180/70 167/70 168/97   Pulse: 56 69 88   Temp:      TempSrc:      Resp:   20   Height:    5\' 11"  (1.803 m)  Weight:    94.348 kg (208 lb)  SpO2: 95%  93%     Physical Exam  Constitutional: Appears well-developed and well-nourished. No distress.  HENT: Normocephalic. No tonsillar erythema or exudates Eyes: Conjunctivae and EOM are normal. PERRLA, no scleral icterus.  Neck: Normal ROM. Neck supple. No JVD. No tracheal deviation. No thyromegaly.  CVS: RRR, S1/S2 +, no murmurs, no gallops, no carotid bruit.  Pulmonary: Effort and breath sounds normal, no stridor, rhonchi, wheezes, rales.  Abdominal: Soft. BS +,  no distension, tenderness, rebound or guarding.  Musculoskeletal: Normal range of motion. No edema and no tenderness.  Lymphadenopathy: No lymphadenopathy noted, cervical, inguinal. Neuro: Alert. Normal reflexes, muscle tone  coordination. No focal neurologic deficits. Skin: Skin is warm and dry. No rash noted.  No erythema. No pallor.  Psychiatric: Normal mood and affect. Behavior, judgment, thought content normal.   Labs on Admission:  Basic Metabolic Panel:  Recent Labs Lab 05/31/14 0132 05/31/14 0937  NA 138 141  K 3.3* 3.7  CL 95* 98  CO2 25 27  GLUCOSE 156* 153*  BUN 13 12  CREATININE 1.15 1.07  CALCIUM 10.0 9.5  MG  --  2.2  PHOS  --  3.2   Liver Function Tests:  Recent Labs Lab 05/31/14 0132 05/31/14 0937  AST 29 27  ALT 17 13  ALKPHOS 93 89  BILITOT 0.7 0.7  PROT 8.1 7.4  ALBUMIN 4.4 3.8    Recent Labs Lab 05/31/14 0132  LIPASE 259*    No results for input(s): AMMONIA in the last 168 hours. CBC:  Recent Labs Lab 05/31/14 0132 05/31/14 0937  WBC 19.1* 15.1*  NEUTROABS  --  13.8*  HGB 16.1 15.4  HCT 47.8 46.7  MCV 96.4 97.5  PLT 265 250   Cardiac Enzymes:  Recent Labs Lab 05/31/14 0132  TROPONINI <0.30   BNP: Invalid input(s): POCBNP CBG: No results for input(s): GLUCAP in the last 168 hours.  If 7PM-7AM, please contact night-coverage [email or web address] Password [email or web address] 05/31/2014, 9:02 PM

## 2014-05-31 NOTE — Consult Note (Signed)
Reason for Consult:  SBO vs enteritis Referring Physician: Dr.  Charlett Lango  Darren Morgan is an 73 y.o. male.  HPI: Wife reported sudden onset of lower abdominal pain, nausea 4 hours prior to admit to the ED.  He reports SOB in the ED. No constipation, no diarrhea, no dysrurea In the ED he was vomiting.  Work up in the ED shows BP up around 7 Am, but no fever, and bradycardic.  Labs show W=K+ 3.3, and glucose up, anion gap up.  WBC 19.1, H/h is 16/47.  Troponin is normal.  EKG shows sinus brady,with wide QRS. CT scan shows multiple dilated fluid filled loops of SB, mid and Right lower abdomen.  Up to 3.1 cm.  Transition point right abdomen, thickening and mucosal edema within the ileum, colon was normalwithout acute inflammatory changes or obstruction, + diverticula.  He was not tender in the ED and no prior abdominal surgeries.  No one in the family has been sick.  All the information is from his wife. He is in no distress and appears comfortable now with NG in place.    Past Medical History  Diagnosis Date  . Memory loss   . Hypertension   . High cholesterol   . Dementia 12/07/2012  . 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 Atrial fibrillation 09/27/2013    Past Surgical History  Procedure Laterality Date  . Knee surgery Right    KNEE CAP REPLACED   No prior abdominal surgeries.    Family History  Problem Relation Age of Onset  . Alzheimer's disease Mother   . Alzheimer's disease Maternal Aunt     Social History:  reports that he has quit smoking. He has never used smokeless tobacco. He reports that he does not drink alcohol or use illicit drugs.  Allergies: No Known Allergies  Medications:  Prior to Admission:  Prescriptions prior to admission  Medication Sig Dispense Refill Last Dose  . aspirin 81 MG tablet Take 81 mg by mouth daily.   05/30/2014 at Unknown time  . donepezil ([name]) 23 MG TABS tablet Take 1 tablet (23 mg total) by mouth at  bedtime. 90 tablet 3 05/30/2014 at Unknown time  . lisinopril (PRINIVIL,ZESTRIL) 10 MG tablet Take 10 mg by mouth daily.   05/30/2014 at Unknown time  . [name] ([name] XR) 28 MG CP24 Take 28 mg by mouth daily. 90 capsule 3 05/30/2014 at Unknown time  . Multiple Vitamins-Minerals (CENTRUM SILVER PO) Take by mouth daily.   05/30/2014 at Unknown time  . sildenafil (VIAGRA) 25 MG tablet Take 1 tablet (25 mg total) by mouth daily as needed for erectile dysfunction. (Patient not taking: Reported on 05/31/2014) 10 tablet 0 Taking   Scheduled: . sodium chloride   Intravenous STAT  . antiseptic oral rinse  7 mL Mouth Rinse q12n4p  . chlorhexidine  15 mL Mouth Rinse BID   Continuous: . sodium chloride     IEP:PIRJJOACZYSAY **OR** acetaminophen, HYDROmorphone (DILAUDID) injection, ondansetron **OR** ondansetron (ZOFRAN) IV Anti-infectives    Start     Dose/Rate Route Frequency Ordered Stop   05/31/14 0600  Ampicillin-Sulbactam (UNASYN) 3 g in sodium chloride 0.9 % 100 mL IVPB     3 g100 mL/hr over 60 Minutes Intravenous  Once 05/31/14 0558 05/31/14 0744     . sodium chloride      Results for orders placed or performed during the hospital encounter of 05/31/14 (from the past 48 hour(s))  CBC     (  if pt has PMH of COPD)     Status: Abnormal   Collection Time: 05/31/14  1:32 AM  Result Value Ref Range   WBC 19.1 (H) 4.0 - 10.5 K/uL   RBC 4.96 4.22 - 5.81 MIL/uL   Hemoglobin 16.1 13.0 - 17.0 g/dL   HCT 47.8 39.0 - 52.0 %   MCV 96.4 78.0 - 100.0 fL   MCH 32.5 26.0 - 34.0 pg   MCHC 33.7 30.0 - 36.0 g/dL   RDW 13.7 11.5 - 15.5 %   Platelets 265 150 - 400 K/uL  Troponin I     Status: None   Collection Time: 05/31/14  1:32 AM  Result Value Ref Range   Troponin I <0.30 <0.30 ng/mL    Comment:        Due to the release kinetics of cTnI, a negative result within the first hours of the onset of symptoms does not rule out myocardial infarction with certainty. If myocardial infarction  is still suspected, repeat the test at appropriate intervals.   Comprehensive metabolic panel     Status: Abnormal   Collection Time: 05/31/14  1:32 AM  Result Value Ref Range   Sodium 138 137 - 147 mEq/L   Potassium 3.3 (L) 3.7 - 5.3 mEq/L   Chloride 95 (L) 96 - 112 mEq/L   CO2 25 19 - 32 mEq/L   Glucose, Bld 156 (H) 70 - 99 mg/dL   BUN 13 6 - 23 mg/dL   Creatinine, Ser 1.15 0.50 - 1.35 mg/dL   Calcium 10.0 8.4 - 10.5 mg/dL   Total Protein 8.1 6.0 - 8.3 g/dL   Albumin 4.4 3.5 - 5.2 g/dL   AST 29 0 - 37 U/L   ALT 17 0 - 53 U/L   Alkaline Phosphatase 93 39 - 117 U/L   Total Bilirubin 0.7 0.3 - 1.2 mg/dL   GFR calc non Af Amer 62 (L) >90 mL/min   GFR calc Af Amer 72 (L) >90 mL/min    Comment: (NOTE) The eGFR has been calculated using the CKD EPI equation. This calculation has not been validated in all clinical situations. eGFR's persistently <90 mL/min signify possible Chronic Kidney Disease.    Anion gap 18 (H) 5 - 15  Lipase, blood     Status: Abnormal   Collection Time: 05/31/14  1:32 AM  Result Value Ref Range   Lipase 259 (H) 11 - 59 U/L  Urinalysis, Routine w reflex microscopic     Status: Abnormal   Collection Time: 05/31/14  3:08 AM  Result Value Ref Range   Color, Urine YELLOW YELLOW   APPearance CLOUDY (A) CLEAR   Specific Gravity, Urine 1.014 1.005 - 1.030   pH 7.5 5.0 - 8.0   Glucose, UA NEGATIVE NEGATIVE mg/dL   Hgb urine dipstick NEGATIVE NEGATIVE   Bilirubin Urine NEGATIVE NEGATIVE   Ketones, ur 15 (A) NEGATIVE mg/dL   Protein, ur 30 (A) NEGATIVE mg/dL   Urobilinogen, UA 0.2 0.0 - 1.0 mg/dL   Nitrite NEGATIVE NEGATIVE   Leukocytes, UA NEGATIVE NEGATIVE  Urine microscopic-add on     Status: None   Collection Time: 05/31/14  3:08 AM  Result Value Ref Range   Squamous Epithelial / LPF RARE RARE   WBC, UA 0-2 <3 WBC/hpf   Bacteria, UA RARE RARE   Urine-Other AMORPHOUS URATES/PHOSPHATES     Dg Chest 2 View (if Patient Has Fever And/or  Copd)  05/31/2014   CLINICAL DATA:  Shortness of  breath.  EXAM: CHEST  2 VIEW  COMPARISON:  September 01, 2013.  FINDINGS: The heart size and mediastinal contours are within normal limits. Both lungs are clear. No pneumothorax or pleural effusion is noted. The visualized skeletal structures are unremarkable.  IMPRESSION: No acute cardiopulmonary abnormality seen.   Electronically Signed   By: Sabino Dick M.D.   On: 05/31/2014 02:11   Ct Abdomen Pelvis W Contrast  05/31/2014   CLINICAL DATA:  Initial valuation for lower abdominal pain since 05/30/2014. Nausea, vomiting. Elevated white blood cell count and lipase.  EXAM: CT ABDOMEN AND PELVIS WITH CONTRAST  TECHNIQUE: Multidetector CT imaging of the abdomen and pelvis was performed using the standard protocol following bolus administration of intravenous contrast.  CONTRAST:  136mL OMNIPAQUE IOHEXOL 300 MG/ML  SOLN  COMPARISON:  None available.  FINDINGS: Subsegmental atelectasis seen dependently within the visualized lung bases. No pleural or pericardial effusion.  The liver demonstrates a normal contrast enhanced appearance. There is trace free perihepatic fluid. Gallbladder within normal limits. No biliary dilatation. Spleen within normal limits. Trace free perisplenic fluid present. Adrenal glands are unremarkable. Pancreas demonstrates a normal appearance without evidence for acute pancreatitis.  Kidneys are equal size with symmetric enhancement. No nephrolithiasis, hydronephrosis, or focal enhancing renal mass. Subcentimeter hypodense lesion within the right kidney is too small the characterize, but statistically likely represents a small cyst.  Stomach within normal limits.  There are multiple mildly prominent fluid-filled loops of small bowel clustered within the mid and lower right abdomen with associated air-fluid levels. These measure up to 3.1 cm in diameter. There is an apparent transition point within the right abdomen (series 2, image 37). There is  thickening and mucosal edema within the ileum at this point. No definite mass lesion identified.  Appendix is normal.  The colon is within normal limits without acute inflammatory changes or evidence of obstruction. Scattered colonic diverticula present without acute diverticulitis.  Bladder within normal limits.  Prostate are unremarkable.  No free intraperitoneal air. No pathologically enlarged intra-abdominal pelvic lymph nodes. Scattered calcified atheromatous plaque present within the intra-abdominal aorta. No aneurysm.  No acute osseous abnormality. No worrisome lytic or blastic osseous lesions. Scatter multilevel degenerative disc disease present within the visualized spine.  IMPRESSION: 1. Findings concerning for small bowel obstruction with transition point in the right abdomen. These findings may be secondary to an acute ileitis with secondary obstruction as there is prominent wall thickening with enhancement in the terminal ileum distal to the transition point. No definite mass lesion or other abnormality to explain mechanical obstruction. Followup to resolution is recommended. 2. Trace free perihepatic and perisplenic fluid, likely reactive due to the underlying obstructive process. 3. Colonic diverticulosis without acute diverticulitis.   Electronically Signed   By: Jeannine Boga M.D.   On: 05/31/2014 04:35    Review of Systems  Constitutional: Negative.   HENT: Negative.   Eyes: Negative.   Respiratory: Negative.   Cardiovascular: Negative.   Gastrointestinal: Positive for nausea, vomiting and abdominal pain. Negative for heartburn, diarrhea, constipation, blood in stool and melena.  Genitourinary: Positive for frequency.  Musculoskeletal: Negative.   Skin: Negative.   Neurological: Negative.   Endo/Heme/Allergies: Negative.   Psychiatric/Behavioral:       Dementia, with limited activity   Blood pressure 158/106, pulse 66, temperature 97.9 F (36.6 C), temperature source Oral,  resp. rate 18, SpO2 95 %. Physical Exam  Constitutional: He appears well-developed and well-nourished. No distress.  HENT:  Head: Normocephalic and  atraumatic.  Nose: Nose normal.  NG in place looks like its draining contrast  Eyes: EOM are normal. Right eye exhibits no discharge. Left eye exhibits no discharge.  Neck: Normal range of motion. Neck supple. No JVD present. No thyromegaly present.  Cardiovascular: Normal heart sounds and intact distal pulses.   No murmur heard. Bradycardic  Respiratory: Effort normal and breath sounds normal. No respiratory distress. He has no wheezes. He has no rales. He exhibits no tenderness.  GI: Soft. He exhibits no distension and no mass. There is no tenderness. There is no rebound and no guarding.  BS diminished.  Musculoskeletal: He exhibits no edema.  Lymphadenopathy:    He has no cervical adenopathy.  Neurological: He is alert. No cranial nerve deficit.  Skin: Skin is warm and dry. No rash noted. He is not diaphoretic. No erythema. No pallor.  Psychiatric: He has a normal mood and affect. His behavior is normal.    Assessment/Plan: SBO vs enteritis Memory loss/dementia Hypertension Hx of atrial fibrillation Dyslipidemia  Plan:  I think the NG is working now, it was not when I came in.  i will recheck film, continue NG drainage, antibiotics.  Will follow with you.  No prior history of abdominal surgery.  Kaysin Brock 05/31/2014, 8:45 AM

## 2014-05-31 NOTE — Progress Notes (Signed)
CARE MANAGEMENT NOTE 05/31/2014  Patient:  Darren Morgan,Darren Morgan   Account Number:  [id number]  Date Initiated:  05/31/2014  Documentation initiated by:  Jiles CrockerHANDLER,Josyah Achor  Subjective/Objective Assessment:   ADMITTED WITH SBO     Action/Plan:   CM FOLLOWING FOR DCP   Anticipated DC Date:  06/07/2014   Anticipated DC Plan:  HOME/SELF CARE  DC Planning Services  CM consult       Status of service:  In process, will continue to follow Medicare Important Message given?   (If response is "NO", the following Medicare IM given date fields will be blank)  Per UR Regulation:  Reviewed for med. necessity/level of care/duration of stay  Comments:  12/17/2015Abelino Derrick- B Elvera Almario RN,BSN,MHA [phone]

## 2014-06-01 ENCOUNTER — Inpatient Hospital Stay (HOSPITAL_COMMUNITY): Payer: Medicare Other

## 2014-06-01 LAB — CBC
HEMATOCRIT: 44.8 % (ref 39.0–52.0)
HEMOGLOBIN: 14.6 g/dL (ref 13.0–17.0)
MCH: 31.9 pg (ref 26.0–34.0)
MCHC: 32.6 g/dL (ref 30.0–36.0)
MCV: 98 fL (ref 78.0–100.0)
Platelets: 220 10*3/uL (ref 150–400)
RBC: 4.57 MIL/uL (ref 4.22–5.81)
RDW: 14.2 % (ref 11.5–15.5)
WBC: 14.3 10*3/uL — AB (ref 4.0–10.5)

## 2014-06-01 LAB — COMPREHENSIVE METABOLIC PANEL
ALBUMIN: 3.1 g/dL — AB (ref 3.5–5.2)
ALT: 10 U/L (ref 0–53)
ANION GAP: 13 (ref 5–15)
AST: 25 U/L (ref 0–37)
Alkaline Phosphatase: 68 U/L (ref 39–117)
BUN: 18 mg/dL (ref 6–23)
CALCIUM: 8.6 mg/dL (ref 8.4–10.5)
CO2: 27 mEq/L (ref 19–32)
CREATININE: 1.25 mg/dL (ref 0.50–1.35)
Chloride: 105 mEq/L (ref 96–112)
GFR calc Af Amer: 65 mL/min — ABNORMAL LOW (ref >90)
GFR calc non Af Amer: 56 mL/min — ABNORMAL LOW (ref >90)
Glucose, Bld: 105 mg/dL — ABNORMAL HIGH (ref 70–99)
POTASSIUM: 3.3 meq/L — AB (ref 3.7–5.3)
Sodium: 145 mEq/L (ref 137–147)
Total Bilirubin: 1.1 mg/dL (ref 0.3–1.2)
Total Protein: 6.2 g/dL (ref 6.0–8.3)

## 2014-06-01 LAB — GLUCOSE, CAPILLARY: GLUCOSE-CAPILLARY: 100 mg/dL — AB (ref 70–99)

## 2014-06-01 MED ORDER — POTASSIUM CHLORIDE IN NACL 40-0.9 MEQ/L-% IV SOLN
INTRAVENOUS | Status: DC
  Administered 2014-06-01 – 2014-06-02 (×3): 75 mL/h via INTRAVENOUS
  Filled 2014-06-01 (×3): qty 1000

## 2014-06-01 NOTE — Progress Notes (Signed)
Subjective: No pain.  Passing gas and having BMs.  No N/V  Objective: Vital signs in last 24 hours: Temp:  [98.6 F (37 C)-98.9 F (37.2 C)] 98.9 F (37.2 C) (12/18 0516) Pulse Rate:  [56-88] 56 (12/18 0516) Resp:  [20] 20 (12/18 0516) BP: (146-168)/(70-97) 155/74 mmHg (12/18 0516) SpO2:  [93 %-95 %] 95 % (12/18 0516) Weight:  [204 lb 5.9 oz (92.7 kg)-208 lb (94.348 kg)] 204 lb 5.9 oz (92.7 kg) (12/18 0516) Last BM Date: 05/31/14  Intake/Output from previous day: 12/17 0701 - 12/18 0700 In: 1545 [P.O.:240; I.V.:1005; IV Piggyback:300] Out: -  Intake/Output this shift:    PE: General- In NAD Abdomen-soft,mild right mid abdominal tenderness  Lab Results:   Recent Labs  05/31/14 0937 06/01/14 0424  WBC 15.1* 14.3*  HGB 15.4 14.6  HCT 46.7 44.8  PLT 250 220   BMET  Recent Labs  05/31/14 0937 06/01/14 0424  NA 141 145  K 3.7 3.3*  CL 98 105  CO2 27 27  GLUCOSE 153* 105*  BUN 12 18  CREATININE 1.07 1.25  CALCIUM 9.5 8.6   PT/INR  Recent Labs  05/31/14 0937  LABPROT 12.9  INR 0.96   Comprehensive Metabolic Panel:    Component Value Date/Time   NA 145 06/01/2014 0424   NA 141 05/31/2014 0937   K 3.3* 06/01/2014 0424   K 3.7 05/31/2014 0937   CL 105 06/01/2014 0424   CL 98 05/31/2014 0937   CO2 27 06/01/2014 0424   CO2 27 05/31/2014 0937   BUN 18 06/01/2014 0424   BUN 12 05/31/2014 0937   CREATININE 1.25 06/01/2014 0424   CREATININE 1.07 05/31/2014 0937   GLUCOSE 105* 06/01/2014 0424   GLUCOSE 153* 05/31/2014 0937   CALCIUM 8.6 06/01/2014 0424   CALCIUM 9.5 05/31/2014 0937   AST 25 06/01/2014 0424   AST 27 05/31/2014 0937   ALT 10 06/01/2014 0424   ALT 13 05/31/2014 0937   ALKPHOS 68 06/01/2014 0424   ALKPHOS 89 05/31/2014 0937   BILITOT 1.1 06/01/2014 0424   BILITOT 0.7 05/31/2014 0937   PROT 6.2 06/01/2014 0424   PROT 7.4 05/31/2014 0937   ALBUMIN 3.1* 06/01/2014 0424   ALBUMIN 3.8 05/31/2014 [id number]     Studies/Results: Dg  Chest 2 View (if Patient Has Fever And/or Copd)  05/31/2014   CLINICAL DATA:  Shortness of breath.  EXAM: CHEST  2 VIEW  COMPARISON:  September 01, 2013.  FINDINGS: The heart size and mediastinal contours are within normal limits. Both lungs are clear. No pneumothorax or pleural effusion is noted. The visualized skeletal structures are unremarkable.  IMPRESSION: No acute cardiopulmonary abnormality seen.   Electronically Signed   By: Roque LiasJames  Green M.D.   On: 05/31/2014 02:11   Ct Abdomen Pelvis W Contrast  05/31/2014   CLINICAL DATA:  Initial valuation for lower abdominal pain since 05/30/2014. Nausea, vomiting. Elevated white blood cell count and lipase.  EXAM: CT ABDOMEN AND PELVIS WITH CONTRAST  TECHNIQUE: Multidetector CT imaging of the abdomen and pelvis was performed using the standard protocol following bolus administration of intravenous contrast.  CONTRAST:  100mL OMNIPAQUE IOHEXOL 300 MG/ML  SOLN  COMPARISON:  None available.  FINDINGS: Subsegmental atelectasis seen dependently within the visualized lung bases. No pleural or pericardial effusion.  The liver demonstrates a normal contrast enhanced appearance. There is trace free perihepatic fluid. Gallbladder within normal limits. No biliary dilatation. Spleen within normal limits. Trace free perisplenic fluid present. Adrenal glands  are unremarkable. Pancreas demonstrates a normal appearance without evidence for acute pancreatitis.  Kidneys are equal size with symmetric enhancement. No nephrolithiasis, hydronephrosis, or focal enhancing renal mass. Subcentimeter hypodense lesion within the right kidney is too small the characterize, but statistically likely represents a small cyst.  Stomach within normal limits.  There are multiple mildly prominent fluid-filled loops of small bowel clustered within the mid and lower right abdomen with associated air-fluid levels. These measure up to 3.1 cm in diameter. There is an apparent transition point within the  right abdomen (series 2, image 37). There is thickening and mucosal edema within the ileum at this point. No definite mass lesion identified.  Appendix is normal.  The colon is within normal limits without acute inflammatory changes or evidence of obstruction. Scattered colonic diverticula present without acute diverticulitis.  Bladder within normal limits.  Prostate are unremarkable.  No free intraperitoneal air. No pathologically enlarged intra-abdominal pelvic lymph nodes. Scattered calcified atheromatous plaque present within the intra-abdominal aorta. No aneurysm.  No acute osseous abnormality. No worrisome lytic or blastic osseous lesions. Scatter multilevel degenerative disc disease present within the visualized spine.  IMPRESSION: 1. Findings concerning for small bowel obstruction with transition point in the right abdomen. These findings may be secondary to an acute ileitis with secondary obstruction as there is prominent wall thickening with enhancement in the terminal ileum distal to the transition point. No definite mass lesion or other abnormality to explain mechanical obstruction. Followup to resolution is recommended. 2. Trace free perihepatic and perisplenic fluid, likely reactive due to the underlying obstructive process. 3. Colonic diverticulosis without acute diverticulitis.   Electronically Signed   By: Rise Mu M.D.   On: 05/31/2014 04:35   Dg Abd 2 Views  06/01/2014   CLINICAL DATA:  Followup small bowel obstruction  EXAM: ABDOMEN - 2 VIEW  COMPARISON:  05/31/2014  FINDINGS: Scattered small bowel dilatation is noted predominately in the left mid abdomen. The overall appearance is stable from the prior exam. No free air is seen. No abnormal mass or abnormal calcifications are noted. Stable degenerative change of the lumbar spine is seen.  IMPRESSION: Stable small bowel dilatation.  Continued followup is recommended.   Electronically Signed   By: Alcide Clever M.D.   On: 06/01/2014  08:24   Dg Abd 2 Views  05/31/2014   CLINICAL DATA:  Small bowel obstruction  EXAM: ABDOMEN - 2 VIEW  COMPARISON:  CT abdomen and pelvis 05/31/2014  FINDINGS: Retained contrast in bladder and renal collecting systems.  Dilated small bowel loops in the upper abdomen likely representing a degree of small bowel obstruction, similar to CT.  Scattered gas present in decompressed:  .  Nasogastric tube and small amount of gas within stomach.  Lung bases grossly clear.  No bowel wall thickening or free intraperitoneal air.  Scattered degenerative disc disease changes lumbar spine.  IMPRESSION: Probable small bowel obstruction, unchanged.   Electronically Signed   By: Ulyses Southward M.D.   On: 05/31/2014 11:09    Anti-infectives: Anti-infectives    Start     Dose/Rate Route Frequency Ordered Stop   05/31/14 1400  Ampicillin-Sulbactam (UNASYN) 3 g in sodium chloride 0.9 % 100 mL IVPB     3 g100 mL/hr over 60 Minutes Intravenous Every 6 hours 05/31/14 1310     05/31/14 0600  Ampicillin-Sulbactam (UNASYN) 3 g in sodium chloride 0.9 % 100 mL IVPB     3 g100 mL/hr over 60 Minutes Intravenous  Once 05/31/14  [id number] 05/31/14 0744      Assessment Active Problems:   Acute Ileitis (most likely infectious or inflammatory) leading to partial SBO-radiographically unchanged but clinically better; x-rays typically lag the clinical course     LOS: 1 day   Plan: Sips of liquids and ice chips today. Would not advance diet too rapidly.   Shonte Beutler J 06/01/2014

## 2014-06-01 NOTE — Progress Notes (Addendum)
[facility] PROGRESS NOTE  Darren Morgan ZOX:[id number] DOB: Oct 18, 1940 DOA: 05/31/2014 PCP: Cain Saupe, MD  Assessment/Plan: Active Problems:   Ileitis   SBO (small bowel obstruction)      HPI:  73 year old male with history of Alzheimer's dementia, hypertension who presented to [facility] ED with complaints of abdominal pain, nausea and vomiting for past 24 hours prior to this admission. Patient is pleasantly confused and not able to provide details of present illness. His family is at the bedside to give more details. Patient apparently ate pizza for dinner and then later complained of mid abdominal pain and nausea. Pt reported vomiting but his wife reported not seeing pt vomit. Patient reported intermittent pain, 5-6/10 in intensity in mid abdomen and Non radiating. No reports of fevers or chills. No diarrhea or constipation. No other complaints such as chest pain, shortness of breath or palpitations. No GU complaints.  In ED, BP was 152/84, HR 42-64, RR 12-22, oxygen saturation 86 - 100%. NG tube inserted in ED but pt pulled it out once he arrived to the floor unit. RN unable to insert it even with 1 dose of ativan being given to the patient. Blood work showed WBC count 19.1, potassium 3.3 (repleted in ED). CXR showed no acute cardiopulmonary findings. Abd x ray showed findings concerning for small bowel obstruction. CT abdomen showed SBO with transition point in the right abdomen. These findings may be secondary to an acute ileitis with secondary obstruction as there is prominent wall thickening with enhancement in the terminal ileum distal to the transition point. Followup to resolution is recommended. Surgery has seen the patient in consultation.   Assessment & Plan    Principal Problem: SBO (small bowel obstruction) / Ileitis / Leukocytosis  - Appreciate surgery consult and recommendations - Pt has pulled out NG tube , to be initiated on sips of liquids and ice by general  surgery - Continue Unasyn started on admission.  - Continue supportive care with IV fluids, analgesia as needed, antiemetics as needed  Diet to be advanced by general surgery   Active Problems; Hypertension - since pt NPO we added hydralazine PRN for blood pressure control   Hypokalemia - due to GI losses Continue to monitor  DVT prophylaxis:  - SCD's bilaterally   Radiological Exams on Admission: Dg Chest 2 View (if Patient Has Fever And/or Copd) 05/31/2014 No acute cardiopulmonary abnormality seen. Electronically Signed By: Roque Lias M.D. On: 05/31/2014 02:11   Ct Abdomen Pelvis W Contrast 05/31/2014 1. Findings concerning for small bowel obstruction with transition point in the right abdomen. These findings may be secondary to an acute ileitis with secondary obstruction as there is prominent wall thickening with enhancement in the terminal ileum distal to the transition point. No definite mass lesion or other abnormality to explain mechanical obstruction. Followup to resolution is recommended. 2. Trace free perihepatic and perisplenic fluid, likely reactive due to the underlying obstructive process. 3. Colonic diverticulosis without acute diverticulitis.   Dg Abd 2 Views 05/31/2014 Probable small bowel obstruction, unchanged. Electronically Signed By: Ulyses Southward M.D. On: 05/31/2014 11:09    Code Status: Full Family Communication: Plan of care discussed with the patient and his family at the bedside  Disposition Plan:  As per general surgery    Consultants:  General surgery  Procedures:  None  Antibiotics: Unasyn  HPI/Subjective: Patient has no complaints, he and his wife are sleeping  comfortably and did not want to talk to me. They have no  complaints to offer  Objective: Filed Vitals:   05/31/14 1619 05/31/14 1736 05/31/14 2125 06/01/14 0516  BP: 168/97  146/70 155/74  Pulse: 88  82 56  Temp:   98.6 F (37 C) 98.9 F (37.2 C)   TempSrc:   Oral Oral  Resp: 20  20 20   Height:  5\' 11"  (1.803 m)    Weight:  94.348 kg (208 lb)  92.7 kg (204 lb 5.9 oz)  SpO2: 93%  94% 95%    Intake/Output Summary (Last 24 hours) at 06/01/14 1347 Last data filed at 06/01/14 0700  Gross per 24 hour  Intake   1545 ml  Output      0 ml  Net   1545 ml    Exam:  General: alert & oriented x 3 In NAD  Cardiovascular: RRR, nl S1 s2  Respiratory: Decreased breath sounds at the bases, scattered rhonchi, no crackles  Abdomen: soft +BS NT/ND, no masses palpable  Extremities: No cyanosis and no edema      Data Reviewed: Basic Metabolic Panel:  Recent Labs Lab 05/31/14 0132 05/31/14 0937 06/01/14 0424  NA 138 141 145  K 3.3* 3.7 3.3*  CL 95* 98 105  CO2 25 27 27   GLUCOSE 156* 153* 105*  BUN 13 12 18   CREATININE 1.15 1.07 1.25  CALCIUM 10.0 9.5 8.6  MG  --  2.2  --   PHOS  --  3.2  --     Liver Function Tests:  Recent Labs Lab 05/31/14 0132 05/31/14 0937 06/01/14 0424  AST 29 27 25   ALT 17 13 10   ALKPHOS 93 89 68  BILITOT 0.7 0.7 1.1  PROT 8.1 7.4 6.2  ALBUMIN 4.4 3.8 3.1*    Recent Labs Lab 05/31/14 0132  LIPASE 259*   No results for input(s): AMMONIA in the last 168 hours.  CBC:  Recent Labs Lab 05/31/14 0132 05/31/14 0937 06/01/14 0424  WBC 19.1* 15.1* 14.3*  NEUTROABS  --  13.8*  --   HGB 16.1 15.4 14.6  HCT 47.8 46.7 44.8  MCV 96.4 97.5 98.0  PLT 265 250 220    Cardiac Enzymes:  Recent Labs Lab 05/31/14 0132  TROPONINI <0.30   BNP (last 3 results) No results for input(s): PROBNP in the last 8760 hours.   CBG:  Recent Labs Lab 06/01/14 0805  GLUCAP 100*    No results found for this or any previous visit (from the past 240 hour(s)).   Studies: Dg Chest 2 View (if Patient Has Fever And/or Copd)  05/31/2014   CLINICAL DATA:  Shortness of breath.  EXAM: CHEST  2 VIEW  COMPARISON:  September 01, 2013.  FINDINGS: The heart size and mediastinal contours are within normal  limits. Both lungs are clear. No pneumothorax or pleural effusion is noted. The visualized skeletal structures are unremarkable.  IMPRESSION: No acute cardiopulmonary abnormality seen.   Electronically Signed   By: Roque LiasJames  Green M.D.   On: 05/31/2014 02:11   Ct Abdomen Pelvis W Contrast  05/31/2014   CLINICAL DATA:  Initial valuation for lower abdominal pain since 05/30/2014. Nausea, vomiting. Elevated white blood cell count and lipase.  EXAM: CT ABDOMEN AND PELVIS WITH CONTRAST  TECHNIQUE: Multidetector CT imaging of the abdomen and pelvis was performed using the standard protocol following bolus administration of intravenous contrast.  CONTRAST:  100mL OMNIPAQUE IOHEXOL 300 MG/ML  SOLN  COMPARISON:  None available.  FINDINGS: Subsegmental atelectasis seen dependently within the visualized lung bases. No  pleural or pericardial effusion.  The liver demonstrates a normal contrast enhanced appearance. There is trace free perihepatic fluid. Gallbladder within normal limits. No biliary dilatation. Spleen within normal limits. Trace free perisplenic fluid present. Adrenal glands are unremarkable. Pancreas demonstrates a normal appearance without evidence for acute pancreatitis.  Kidneys are equal size with symmetric enhancement. No nephrolithiasis, hydronephrosis, or focal enhancing renal mass. Subcentimeter hypodense lesion within the right kidney is too small the characterize, but statistically likely represents a small cyst.  Stomach within normal limits.  There are multiple mildly prominent fluid-filled loops of small bowel clustered within the mid and lower right abdomen with associated air-fluid levels. These measure up to 3.1 cm in diameter. There is an apparent transition point within the right abdomen (series 2, image 37). There is thickening and mucosal edema within the ileum at this point. No definite mass lesion identified.  Appendix is normal.  The colon is within normal limits without acute inflammatory  changes or evidence of obstruction. Scattered colonic diverticula present without acute diverticulitis.  Bladder within normal limits.  Prostate are unremarkable.  No free intraperitoneal air. No pathologically enlarged intra-abdominal pelvic lymph nodes. Scattered calcified atheromatous plaque present within the intra-abdominal aorta. No aneurysm.  No acute osseous abnormality. No worrisome lytic or blastic osseous lesions. Scatter multilevel degenerative disc disease present within the visualized spine.  IMPRESSION: 1. Findings concerning for small bowel obstruction with transition point in the right abdomen. These findings may be secondary to an acute ileitis with secondary obstruction as there is prominent wall thickening with enhancement in the terminal ileum distal to the transition point. No definite mass lesion or other abnormality to explain mechanical obstruction. Followup to resolution is recommended. 2. Trace free perihepatic and perisplenic fluid, likely reactive due to the underlying obstructive process. 3. Colonic diverticulosis without acute diverticulitis.   Electronically Signed   By: Rise MuBenjamin  McClintock M.D.   On: 05/31/2014 04:35   Dg Abd 2 Views  06/01/2014   CLINICAL DATA:  Followup small bowel obstruction  EXAM: ABDOMEN - 2 VIEW  COMPARISON:  05/31/2014  FINDINGS: Scattered small bowel dilatation is noted predominately in the left mid abdomen. The overall appearance is stable from the prior exam. No free air is seen. No abnormal mass or abnormal calcifications are noted. Stable degenerative change of the lumbar spine is seen.  IMPRESSION: Stable small bowel dilatation.  Continued followup is recommended.   Electronically Signed   By: Alcide CleverMark  Lukens M.D.   On: 06/01/2014 08:24   Dg Abd 2 Views  05/31/2014   CLINICAL DATA:  Small bowel obstruction  EXAM: ABDOMEN - 2 VIEW  COMPARISON:  CT abdomen and pelvis 05/31/2014  FINDINGS: Retained contrast in bladder and renal collecting systems.   Dilated small bowel loops in the upper abdomen likely representing a degree of small bowel obstruction, similar to CT.  Scattered gas present in decompressed:  .  Nasogastric tube and small amount of gas within stomach.  Lung bases grossly clear.  No bowel wall thickening or free intraperitoneal air.  Scattered degenerative disc disease changes lumbar spine.  IMPRESSION: Probable small bowel obstruction, unchanged.   Electronically Signed   By: Ulyses SouthwardMark  Boles M.D.   On: 05/31/2014 11:09    Scheduled Meds: . ampicillin-sulbactam (UNASYN) IV  3 g Intravenous Q6H  . antiseptic oral rinse  7 mL Mouth Rinse q12n4p  . chlorhexidine  15 mL Mouth Rinse BID   Continuous Infusions: . 0.9 % NaCl with KCl 40 mEq /  L 75 mL/hr (06/01/14 1106)    Active Problems:   Ileitis   SBO (small bowel obstruction)    Time spent: 40 minutes   [facility]  [facility] Pager [phone]. If 7PM-7AM, please contact night-coverage at [email or web address], password [facility] 06/01/2014, 1:47 PM  LOS: 1 day

## 2014-06-02 LAB — GLUCOSE, CAPILLARY: GLUCOSE-CAPILLARY: 104 mg/dL — AB (ref 70–99)

## 2014-06-02 MED ORDER — METRONIDAZOLE 500 MG PO TABS: 500.0000 mg | ORAL_TABLET | Freq: Three times a day (TID) | ORAL | Status: DC

## 2014-06-02 MED ORDER — CIPROFLOXACIN HCL 250 MG PO TABS: 250.0000 mg | ORAL_TABLET | Freq: Two times a day (BID) | ORAL | Status: DC

## 2014-06-02 NOTE — Discharge Summary (Signed)
Physician Discharge Summary  Darren Morgan ZOX:[id number] DOB: 10/06/1940 DOA: 05/31/2014  PCP: Cain SaupeFULP, CAMMIE, MD  Admit date: 05/31/2014 Discharge date: 06/02/2014  Time spent: 45 minutes  Recommendations for Outpatient Follow-up:  -Will be discharged home today. -Advised to follow-up with primary care provider in 2 weeks.   Discharge Diagnoses:  Active Problems:   Ileitis   SBO (small bowel obstruction)   Discharge Condition: Stable and improved  Filed Weights   05/31/14 1736 06/01/14 0516 06/02/14 0550  Weight: 94.348 kg (208 lb) 92.7 kg (204 lb 5.9 oz) 94.167 kg (207 lb 9.6 oz)    History of present illness:  [age] year old male with history of Alzheimer's dementia, hypertension who [facility] with complaints of abdominal pain, nausea and vomiting for past 24 hours prior to this admission. Patient is pleasantly confused and not able to provide details of present illness. His family is at the bedside to give more details. Patient apparently ate pizza for dinner and then later complained of mid abdominal pain and nausea. Pt reported vomiting but his wife reported not seeing pt vomit. Patient reported intermittent pain, 5-6/10 in intensity in mid abdomen and Non radiating. No reports of fevers or chills. No diarrhea or constipation. No other complaints such as chest pain, shortness of breath or palpitations. No GU complaints.  In ED, BP was 152/84, HR 42-64, RR 12-22, oxygen saturation 86 - 100%. NG tube inserted in ED but pt pulled it out once he arrived to the floor unit. RN unable to insert it even with 1 dose of ativan being given to the patient. Blood work showed WBC count 19.1, potassium 3.3 (repleted in ED). CXR showed no acute cardiopulmonary findings. Abd x ray showed findings concerning for small bowel obstruction. CT abdomen showed SBO with transition point in the right abdomen. These findings may be secondary to an acute ileitis with secondary obstruction as  there is prominent wall thickening with enhancement in the terminal ileum distal to the transition point. Followup to resolution is recommended. Surgery has seen the patient in consultation.   Hospital Course:   Small bowel obstruction/ileitis -Small bowel obstruction likely brought on by inflammation and infection with ileitis. -As tolerating a semisolid diet. -We'll transition to oral Cipro Flagyl to complete 1 more week and will discharge home today to follow up with his primary care provider. -Surgery has signed off.  Hypertension -Well-controlled.  Alzheimer's dementia -Patient remained pleasantly confused in the hospital. -Continue home medications stop  Procedures:  None   Consultations:  Surgery  Discharge Instructions  Discharge Instructions    Increase activity slowly    Complete by:  As directed             Medication List    TAKE these medications        aspirin 81 MG tablet  Take 81 mg by mouth daily.     CENTRUM SILVER PO  Take by mouth daily.     ciprofloxacin 250 MG tablet  Commonly known as:  CIPRO  Take 1 tablet (250 mg total) by mouth 2 (two) times daily.     donepezil 23 MG Tabs tablet  Commonly known as:  [name]  Take 1 tablet (23 mg total) by mouth at bedtime.     lisinopril 10 MG tablet  Commonly known as:  PRINIVIL,ZESTRIL  Take 10 mg by mouth daily.     [name] 28 MG Cp24  Commonly known as:  [name] XR  Take 28 mg by mouth daily.     metroNIDAZOLE 500 MG tablet  Commonly known as:  FLAGYL  Take 1 tablet (500 mg total) by mouth 3 (three) times daily.     sildenafil 25 MG tablet  Commonly known as:  VIAGRA  Take 1 tablet (25 mg total) by mouth daily as needed for erectile dysfunction.       No Known Allergies     Follow-up Information    Follow up with FULP, CAMMIE, MD. Schedule an appointment as soon as possible for a visit in 2 weeks.   Specialty:  Family Medicine   Contact information:   [address] [place] [id number] [phone]        The results of significant diagnostics from this hospitalization (including imaging, microbiology, ancillary and laboratory) are listed below for reference.    Significant Diagnostic Studies: Dg Chest 2 View (if Patient Has Fever And/or Copd)  05/31/2014   CLINICAL DATA:  Shortness of breath.  EXAM: CHEST  2 VIEW  COMPARISON:  September 01, 2013.  FINDINGS: The heart size and mediastinal contours are within normal limits. Both lungs are clear. No pneumothorax or pleural effusion is noted. The visualized skeletal structures are unremarkable.  IMPRESSION: No acute cardiopulmonary abnormality seen.   Electronically Signed   By: Roque LiasJames  Green M.D.   On: 05/31/2014 02:11   Ct Abdomen Pelvis W Contrast  05/31/2014   CLINICAL DATA:  Initial valuation for lower abdominal pain since 05/30/2014. Nausea, vomiting. Elevated white blood cell count and lipase.  EXAM: CT ABDOMEN AND PELVIS WITH CONTRAST  TECHNIQUE: Multidetector CT imaging of the abdomen and pelvis was performed using the standard protocol following bolus administration of intravenous contrast.  CONTRAST:  100mL OMNIPAQUE IOHEXOL 300 MG/ML  SOLN  COMPARISON:  None available.  FINDINGS: Subsegmental atelectasis seen dependently within the visualized lung bases. No pleural or pericardial effusion.  The liver demonstrates a normal contrast enhanced appearance. There is trace free perihepatic fluid. Gallbladder within normal limits. No biliary dilatation. Spleen within normal limits. Trace free perisplenic fluid present. Adrenal glands are unremarkable. Pancreas demonstrates a normal appearance without evidence for acute pancreatitis.  Kidneys are equal size with symmetric enhancement. No nephrolithiasis, hydronephrosis, or focal enhancing renal mass. Subcentimeter hypodense lesion within the right kidney is too small the characterize, but statistically likely represents a small cyst.  Stomach within  normal limits.  There are multiple mildly prominent fluid-filled loops of small bowel clustered within the mid and lower right abdomen with associated air-fluid levels. These measure up to 3.1 cm in diameter. There is an apparent transition point within the right abdomen (series 2, image 37). There is thickening and mucosal edema within the ileum at this point. No definite mass lesion identified.  Appendix is normal.  The colon is within normal limits without acute inflammatory changes or evidence of obstruction. Scattered colonic diverticula present without acute diverticulitis.  Bladder within normal limits.  Prostate are unremarkable.  No free intraperitoneal air. No pathologically enlarged intra-abdominal pelvic lymph nodes. Scattered calcified atheromatous plaque present within the intra-abdominal aorta. No aneurysm.  No acute osseous abnormality. No worrisome lytic or blastic osseous lesions. Scatter multilevel degenerative disc disease present within the visualized spine.  IMPRESSION: 1. Findings concerning for small bowel obstruction with transition point in the right abdomen. These findings may be secondary to an acute ileitis with secondary obstruction as there is prominent wall thickening with enhancement in the terminal ileum distal to  the transition point. No definite mass lesion or other abnormality to explain mechanical obstruction. Followup to resolution is recommended. 2. Trace free perihepatic and perisplenic fluid, likely reactive due to the underlying obstructive process. 3. Colonic diverticulosis without acute diverticulitis.   Electronically Signed   By: Rise Mu M.D.   On: 05/31/2014 04:35   Dg Abd 2 Views  06/01/2014   CLINICAL DATA:  Followup small bowel obstruction  EXAM: ABDOMEN - 2 VIEW  COMPARISON:  05/31/2014  FINDINGS: Scattered small bowel dilatation is noted predominately in the left mid abdomen. The overall appearance is stable from the prior exam. No free air is  seen. No abnormal mass or abnormal calcifications are noted. Stable degenerative change of the lumbar spine is seen.  IMPRESSION: Stable small bowel dilatation.  Continued followup is recommended.   Electronically Signed   By: Alcide Clever M.D.   On: 06/01/2014 08:24   Dg Abd 2 Views  05/31/2014   CLINICAL DATA:  Small bowel obstruction  EXAM: ABDOMEN - 2 VIEW  COMPARISON:  CT abdomen and pelvis 05/31/2014  FINDINGS: Retained contrast in bladder and renal collecting systems.  Dilated small bowel loops in the upper abdomen likely representing a degree of small bowel obstruction, similar to CT.  Scattered gas present in decompressed:  .  Nasogastric tube and small amount of gas within stomach.  Lung bases grossly clear.  No bowel wall thickening or free intraperitoneal air.  Scattered degenerative disc disease changes lumbar spine.  IMPRESSION: Probable small bowel obstruction, unchanged.   Electronically Signed   By: Ulyses Southward M.D.   On: 05/31/2014 11:09    Microbiology: No results found for this or any previous visit (from the past 240 hour(s)).   Labs: Basic Metabolic Panel:  Recent Labs Lab 05/31/14 0132 05/31/14 0937 06/01/14 0424  NA 138 141 145  K 3.3* 3.7 3.3*  CL 95* 98 105  CO2 25 27 27   GLUCOSE 156* 153* 105*  BUN 13 12 18   CREATININE 1.15 1.07 1.25  CALCIUM 10.0 9.5 8.6  MG  --  2.2  --   PHOS  --  3.2  --    Liver Function Tests:  Recent Labs Lab 05/31/14 0132 05/31/14 0937 06/01/14 0424  AST 29 27 25   ALT 17 13 10   ALKPHOS 93 89 68  BILITOT 0.7 0.7 1.1  PROT 8.1 7.4 6.2  ALBUMIN 4.4 3.8 3.1*    Recent Labs Lab 05/31/14 0132  LIPASE 259*   No results for input(s): AMMONIA in the last 168 hours. CBC:  Recent Labs Lab 05/31/14 0132 05/31/14 0937 06/01/14 0424  WBC 19.1* 15.1* 14.3*  NEUTROABS  --  13.8*  --   HGB 16.1 15.4 14.6  HCT 47.8 46.7 44.8  MCV 96.4 97.5 98.0  PLT 265 250 220   Cardiac Enzymes:  Recent Labs Lab 05/31/14 0132    TROPONINI <0.30   BNP: BNP (last 3 results) No results for input(s): PROBNP in the last 8760 hours. CBG:  Recent Labs Lab 06/01/14 0805 06/02/14 0743  GLUCAP 100* 104*       Signed:  HERNANDEZ ACOSTA,ESTELA  [facility] Pager: [phone] 06/02/2014, 5:13 PM

## 2014-06-02 NOTE — Progress Notes (Signed)
Subjective: No pain.  Tolerating liquids.  Bowels moving.  Objective: Vital signs in last 24 hours: Temp:  [98.1 F (36.7 C)-98.4 F (36.9 C)] 98.4 F (36.9 C) (12/19 0550) Pulse Rate:  [58-75] 69 (12/19 0550) Resp:  [18-20] 18 (12/19 0550) BP: (169-176)/(88-89) 176/89 mmHg (12/19 0550) SpO2:  [93 %-95 %] 95 % (12/19 0550) Weight:  [207 lb 9.6 oz (94.167 kg)] 207 lb 9.6 oz (94.167 kg) (12/19 0550) Last BM Date: 06/02/14  Intake/Output from previous day: 12/18 0701 - 12/19 0700 In: 1832.5 [P.O.:240; I.V.:1492.5; IV Piggyback:100] Out: -  Intake/Output this shift:    PE: General- In NAD Abdomen-soft, no tenderness this AM  Lab Results:   Recent Labs  05/31/14 0937 06/01/14 0424  WBC 15.1* 14.3*  HGB 15.4 14.6  HCT 46.7 44.8  PLT 250 220   BMET  Recent Labs  05/31/14 0937 06/01/14 0424  NA 141 145  K 3.7 3.3*  CL 98 105  CO2 27 27  GLUCOSE 153* 105*  BUN 12 18  CREATININE 1.07 1.25  CALCIUM 9.5 8.6   PT/INR  Recent Labs  05/31/14 0937  LABPROT 12.9  INR 0.96   Comprehensive Metabolic Panel:    Component Value Date/Time   NA 145 06/01/2014 0424   NA 141 05/31/2014 0937   K 3.3* 06/01/2014 0424   K 3.7 05/31/2014 0937   CL 105 06/01/2014 0424   CL 98 05/31/2014 0937   CO2 27 06/01/2014 0424   CO2 27 05/31/2014 0937   BUN 18 06/01/2014 0424   BUN 12 05/31/2014 0937   CREATININE 1.25 06/01/2014 0424   CREATININE 1.07 05/31/2014 0937   GLUCOSE 105* 06/01/2014 0424   GLUCOSE 153* 05/31/2014 0937   CALCIUM 8.6 06/01/2014 0424   CALCIUM 9.5 05/31/2014 0937   AST 25 06/01/2014 0424   AST 27 05/31/2014 0937   ALT 10 06/01/2014 0424   ALT 13 05/31/2014 0937   ALKPHOS 68 06/01/2014 0424   ALKPHOS 89 05/31/2014 0937   BILITOT 1.1 06/01/2014 0424   BILITOT 0.7 05/31/2014 0937   PROT 6.2 06/01/2014 0424   PROT 7.4 05/31/2014 0937   ALBUMIN 3.1* 06/01/2014 0424   ALBUMIN 3.8 05/31/2014 0937     Studies/Results: Dg Abd 2  Views  06/01/2014   CLINICAL DATA:  Followup small bowel obstruction  EXAM: ABDOMEN - 2 VIEW  COMPARISON:  05/31/2014  FINDINGS: Scattered small bowel dilatation is noted predominately in the left mid abdomen. The overall appearance is stable from the prior exam. No free air is seen. No abnormal mass or abnormal calcifications are noted. Stable degenerative change of the lumbar spine is seen.  IMPRESSION: Stable small bowel dilatation.  Continued followup is recommended.   Electronically Signed   By: Alcide CleverMark  Lukens M.D.   On: 06/01/2014 08:24   Dg Abd 2 Views  05/31/2014   CLINICAL DATA:  Small bowel obstruction  EXAM: ABDOMEN - 2 VIEW  COMPARISON:  CT abdomen and pelvis 05/31/2014  FINDINGS: Retained contrast in bladder and renal collecting systems.  Dilated small bowel loops in the upper abdomen likely representing a degree of small bowel obstruction, similar to CT.  Scattered gas present in decompressed:  .  Nasogastric tube and small amount of gas within stomach.  Lung bases grossly clear.  No bowel wall thickening or free intraperitoneal air.  Scattered degenerative disc disease changes lumbar spine.  IMPRESSION: Probable small bowel obstruction, unchanged.   Electronically Signed   By: Angelyn PuntMark  Boles M.D.  On: 05/31/2014 11:09    Anti-infectives: Anti-infectives    Start     Dose/Rate Route Frequency Ordered Stop   05/31/14 1400  Ampicillin-Sulbactam (UNASYN) 3 g in sodium chloride 0.9 % 100 mL IVPB     3 g100 mL/hr over 60 Minutes Intravenous Every 6 hours 05/31/14 1310     05/31/14 0600  Ampicillin-Sulbactam (UNASYN) 3 g in sodium chloride 0.9 % 100 mL IVPB     3 g100 mL/hr over 60 Minutes Intravenous  Once 05/31/14 0558 05/31/14 0744      Assessment Active Problems:   Acute Ileitis (most likely infectious or inflammatory) leading to partial SBO-continued clinical improvement.     LOS: 2 days   Plan: Full liquids.  If tolerated could advance to soft diet and discharge home of oral  antibiotics with follow up by PCP.  Please call if we can be of further assistance.   Kortny Lirette J 06/02/2014

## 2014-07-09 ENCOUNTER — Other Ambulatory Visit: Payer: Self-pay | Admitting: Neurology

## 2014-12-20 ENCOUNTER — Ambulatory Visit: Payer: Medicare Other | Admitting: Neurology

## 2014-12-21 ENCOUNTER — Ambulatory Visit: Payer: Medicare Other | Admitting: Neurology

## 2014-12-31 ENCOUNTER — Encounter: Payer: Self-pay | Admitting: Neurology

## 2014-12-31 ENCOUNTER — Ambulatory Visit (INDEPENDENT_AMBULATORY_CARE_PROVIDER_SITE_OTHER): Payer: Medicare Other | Admitting: Neurology

## 2014-12-31 VITALS — BP 170/90 | HR 76 | Resp 20 | Ht 71.0 in | Wt 211.5 lb

## 2014-12-31 DIAGNOSIS — N3946 Mixed incontinence: Secondary | ICD-10-CM

## 2014-12-31 DIAGNOSIS — G309 Alzheimer's disease, unspecified: Secondary | ICD-10-CM

## 2014-12-31 DIAGNOSIS — F068 Other specified mental disorders due to known physiological condition: Secondary | ICD-10-CM | POA: Diagnosis not present

## 2014-12-31 DIAGNOSIS — F0280 Dementia in other diseases classified elsewhere without behavioral disturbance: Secondary | ICD-10-CM

## 2014-12-31 DIAGNOSIS — F0390 Unspecified dementia without behavioral disturbance: Secondary | ICD-10-CM | POA: Insufficient documentation

## 2014-12-31 MED ORDER — DONEPEZIL HCL 23 MG PO TABS: 23.0000 mg | ORAL_TABLET | Freq: Every day | ORAL | Status: DC

## 2014-12-31 MED ORDER — MEMANTINE HCL ER 28 MG PO CP24: 28.0000 mg | ORAL_CAPSULE | Freq: Every day | ORAL | Status: DC

## 2014-12-31 NOTE — Progress Notes (Signed)
PATIENT: Darren Morgan DOB: April 04, 1941  REASON FOR VISIT: routine follow up for dementia HISTORY FROM: patient  HISTORY OF PRESENT ILLNESS: Darren Morgan is a 74 y.o. male here as a referral from [name], here in a revisit for dementia.  [name] January 2015 documented global atrophy.   Interval history 12-31-14 : Darren Morgan , a 74 year old Caucasian right-handed gentleman presents today for revisit to test memory in the presence of his wife.  Early spring of this year in 2016 he suffered a bowel obstruction and had to be admitted to hospital for about 5 days. During this time and not now is orogastric suction tube was placed he was unable to take any oral medication or full. His wife noted immediately after this time a decline in cognitive function and it seems to have been a foster declined and his underlying cognitive disorder would suggest. A year ago when the patient was seen by Heide Guile and he had been stable on medication.  This is no longer the case the Mini-Mental Status Examination for July 2016 shows a score of only 16 out of 30 points. The clock face could not be drawn the copying of an image was too interlocking pentagons was not copy correctly his animal fluency test was 14. There has been no change in handwriting. The patient has very little facial expression. There have been  behavioral changes after significant illness when ever any changes in the home, time planning, any variability from his Routines leads to anxiety, to panic, to confusion.    He remains on an  increased dose of donepezil well.  \He also continues to take [name] without problem. MMSE testing one year earlier   was at a score of 21/30 with deficits in delayed recall 0/3, errors in attention and calculation and orientation to time and place. Animal fluency testing is 12 today. Geriatric depression score 1 only.  The patient is sparse with words.  He has lost at least once control of bladder and bowel ,  had frequent urge incontinence.    06/20/13 (CD):  Darren Morgan has been referred for a memory evaluation and be repeated MMSE test today. His last visit in 2012 showed 19 points on MMSE , and 15 points on the animal fluency test.  In March 2013 he had an MMSE of 21 of 30 points and in his last visit from June 2014 , MMSE scored 24/30 points. Today's MMSE shows 20 of 30 points- a stable finding. [name] is most of his points in the recall of 3 words but was able to spell backwards 3 of the 5 letters - an attention and calculation alternative was not given. The clock drawing test was performed with 4/4 points and the animal fluency test at 8 points. The patient is usually quiet and not talkative, has never been. There is no reported change in personaliy or character. He is not longer driving.  [name] 2 points, He is willing to change to aricept 23 mg after the 10 mg dose is completed. The patient continues to use word finding games, suduko and reads books at home for brain exercise, but he likes mainly to watch TV.  He does walk daily for exercise, has to be remainded. HAnd e uses a push mower to cut the grass , he is not computer literate at this point and has not made any attempts to play with luminosity or other brain stimulating games.Darren Morgan is a singing at the  church choir, which is an important social activity for his wife and him.  [name] in 2005 under [name] was negative for vascular dementia. Her husband continued to repeat himself in conversation, repeatedly asked the same question. His mother had dementia and seemed to have undergone some personality change. At the time the patient developed a decreased facial expression and also was not as fluent as his work output than he used to be.  He had no weakness ,no falls, no spasticity. And today no tremor. No movement restriction. No EOM abnormality. Fall risk remains at 2 points. Erectile dysfunction has become evident to his wife, but no other  dysautonomic symptoms wer endorsed , such as dizziness, neuropathy, abnormal sweating or Orthostasis.  Last Visit note:   REVIEW OF SYSTEMS: Full 14 system review of systems performed and notable only for: memory loss Masked face, rigor, verbal poverty .   ALLERGIES: No Known Allergies  HOME MEDICATIONS: Outpatient Prescriptions Prior to Visit  Medication Sig Dispense Refill  . aspirin 81 MG tablet Take 81 mg by mouth daily.    Marland Kitchen donepezil ([name]) 23 MG TABS tablet take 1 tablet by mouth at bedtime 90 tablet 1  . lisinopril (PRINIVIL,ZESTRIL) 10 MG tablet Take 10 mg by mouth daily.    . metroNIDAZOLE (FLAGYL) 500 MG tablet Take 1 tablet (500 mg total) by mouth 3 (three) times daily. 21 tablet 0  . Multiple Vitamins-Minerals (CENTRUM SILVER PO) Take by mouth daily.    Marland Kitchen [name] XR 28 MG CP24 24 hr capsule take 1 capsule by mouth daily 90 capsule 1  . sildenafil (VIAGRA) 25 MG tablet Take 1 tablet (25 mg total) by mouth daily as needed for erectile dysfunction. 10 tablet 0  . ciprofloxacin (CIPRO) 250 MG tablet Take 1 tablet (250 mg total) by mouth 2 (two) times daily. 14 tablet 0   No facility-administered medications prior to visit.    PHYSICAL EXAM Filed Vitals:   12/31/14 1300  BP: 170/90  Pulse: 76  Resp: 20  Weight: 211 lb 8 oz (95.936 kg)   Body mass index is 29.51 kg/(m^2).  Physical exam:  General: The patient is awake, alert and appears not in acute distress. The patient is well groomed.  Head: Normocephalic, atraumatic. Neck is supple. Mallampati 3 , neck circumference 14.5 inches .  Cardiovascular: regular rate and rhythm without murmurs or carotid bruit, and without distended neck veins.  Respiratory: Lungs are clear to auscultation.  Skin: Without evidence of edema, or rash  Trunk: BMI is elevated , patient has normal posture.  Neurologic exam :  The patient is awake and alert, oriented to place and time.  Memory subjective described as " intact' he is very  passive and docile, he only answers closed questions. His MMSE was 16 out of 30, last visit was 21/30. MOCA deferred.  Speech is non fluent, sparse - without dysarthria, mild dysphonia.  Mood and affect: flat- no facial expression.  Cranial nerves:  Pupils are equal and briskly reactive to light. Extraocular movements in vertical and horizontal planes intact and without nystagmus.  Visual fields by finger perimetry are intact. Hearing to finger rub intact. Facial sensation intact to fine touch. Facial motor strength is symmetric and tongue and uvula move midline.  Motor exam: Normal tone and normal muscle bulk and symmetric normal strength in all extremities.  Sensory: Fine touch, pinprick and vibration were normal.  Coordination: Rapid alternating movements in the fingers/hands is slowed, Finger-to-nose maneuver tested and normal  without evidence of ataxia, dysmetria or tremor.  Gait and station: Patient walks without assistive device, he rises without bracing himself.  Strength within normal limits. Stance is stable and normal. Tandem gait is impossible, he is wider based for stability, attributed to his "bad' knee.   Unfragmented gait over 50 yards. Romberg testing is negative .  Deep tendon reflexes: in the upper and lower extremities are attenuated - Babinski maneuver downgoing.   ASSESSMENT: 74 y.o. 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 here with Dementia, early Stage In 2011, now in 2016 progressed to moderate- severely  with new  behavioral disturbance. With recent bowel obstruction he suffered a faster decline - on top of the continued progressive cognitive decline there is a strong family history on the maternal line of dementia. The patient's mother was affected as well as maternal aunts. No REM behavior.  No rigor or cog-wheeling. Good grip strength.   PLAN:  Alzheimer's type dementia. Progressed .  Continue aricept 23 mg daily, Continue [name] XR. Driving not permitted ,  Continue healthy diet and mentally stimulating activities. RV in 6 month with NP or me,  Dr. Vickey Hugerohmeier, and sooner as needed. MMSE-  memory appointments all 30 minutes.   12/31/2014, 1:13 PM [facility] [address], [address] [place], [place] [address] [phone]  \

## 2015-04-24 IMAGING — CR DG CHEST 2V
2 series · 2 of 2 positions shown · non-contrast
Comparison: September 01, 2013.

CLINICAL DATA: Shortness of breath.

EXAM:
CHEST  2 VIEW

[w chest lat]
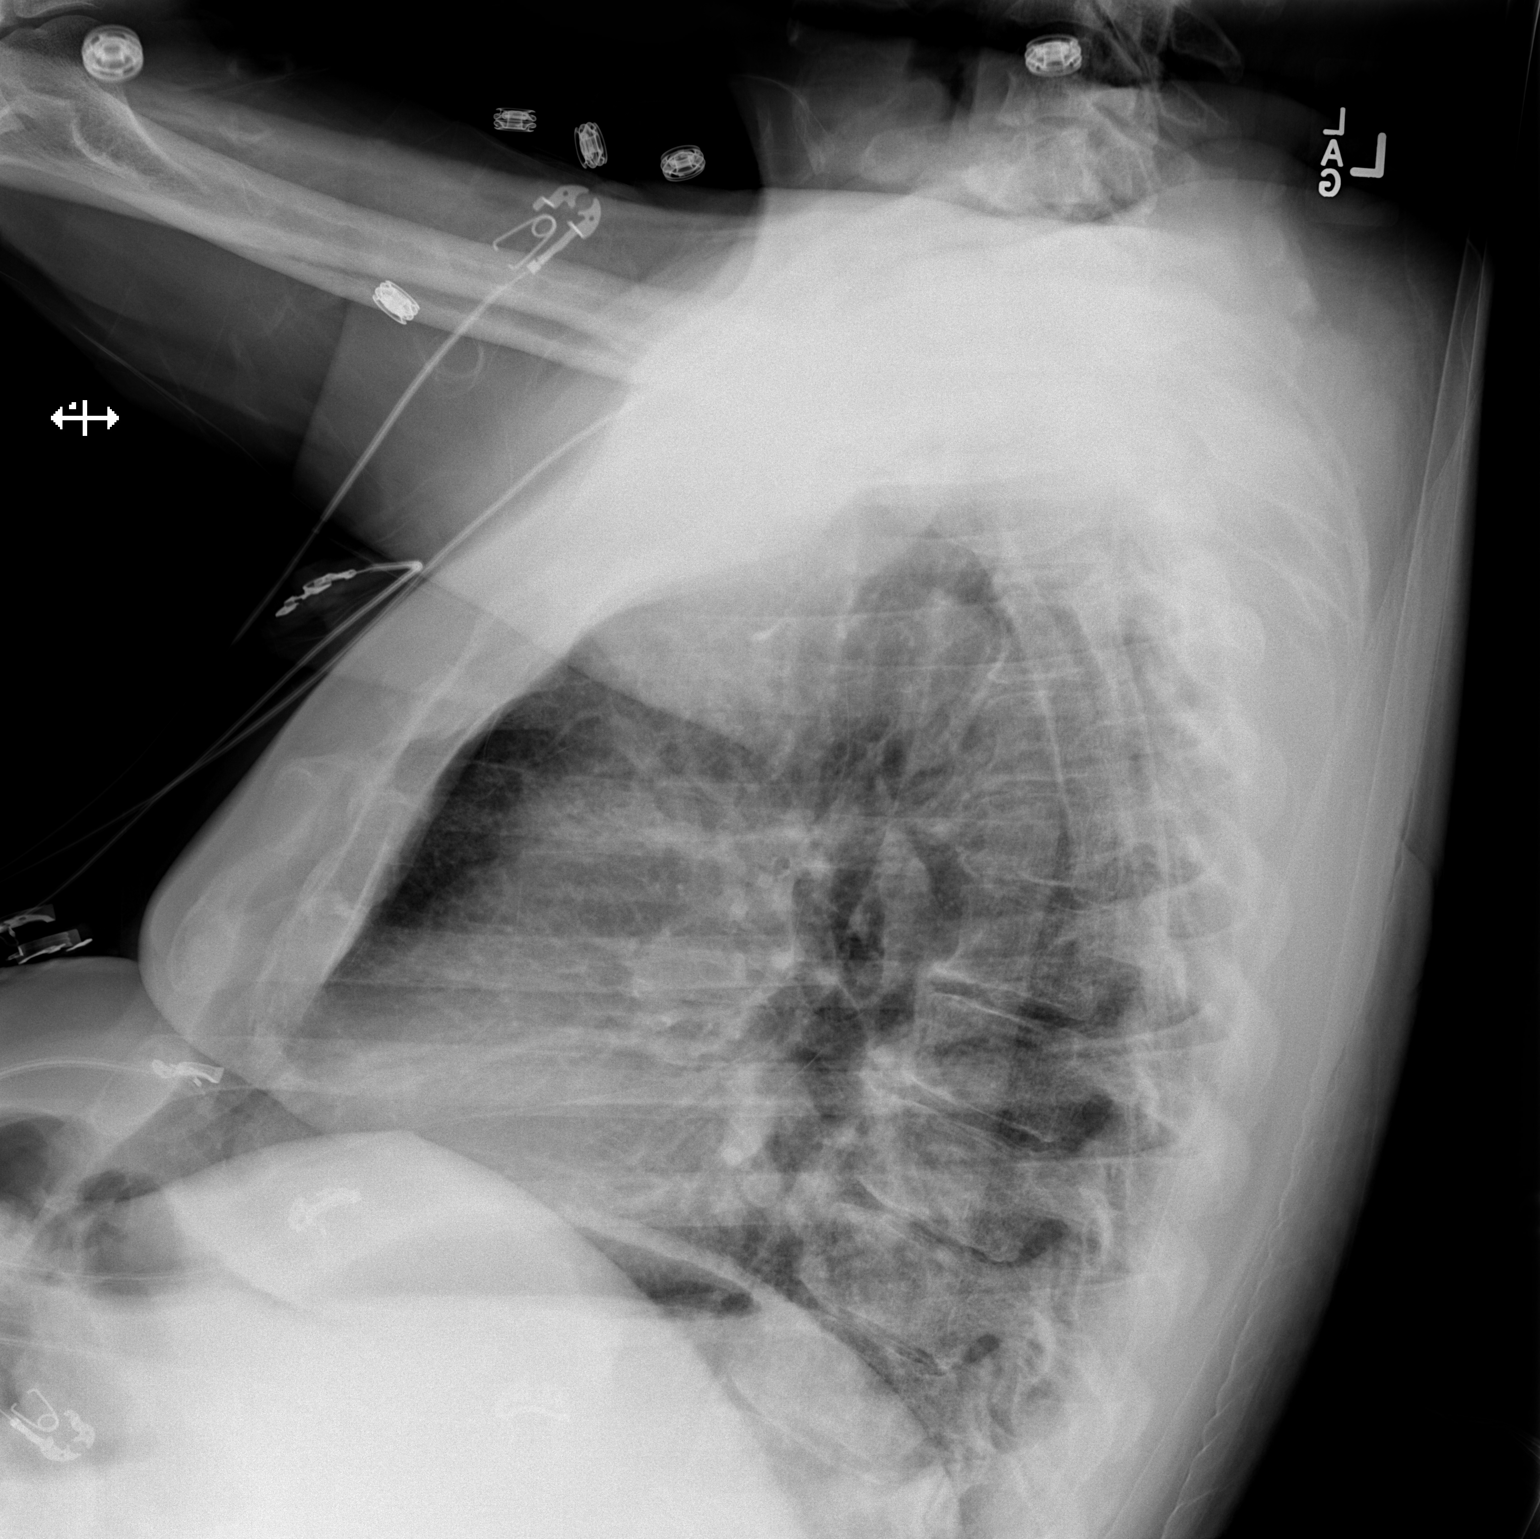

[x chest ap]
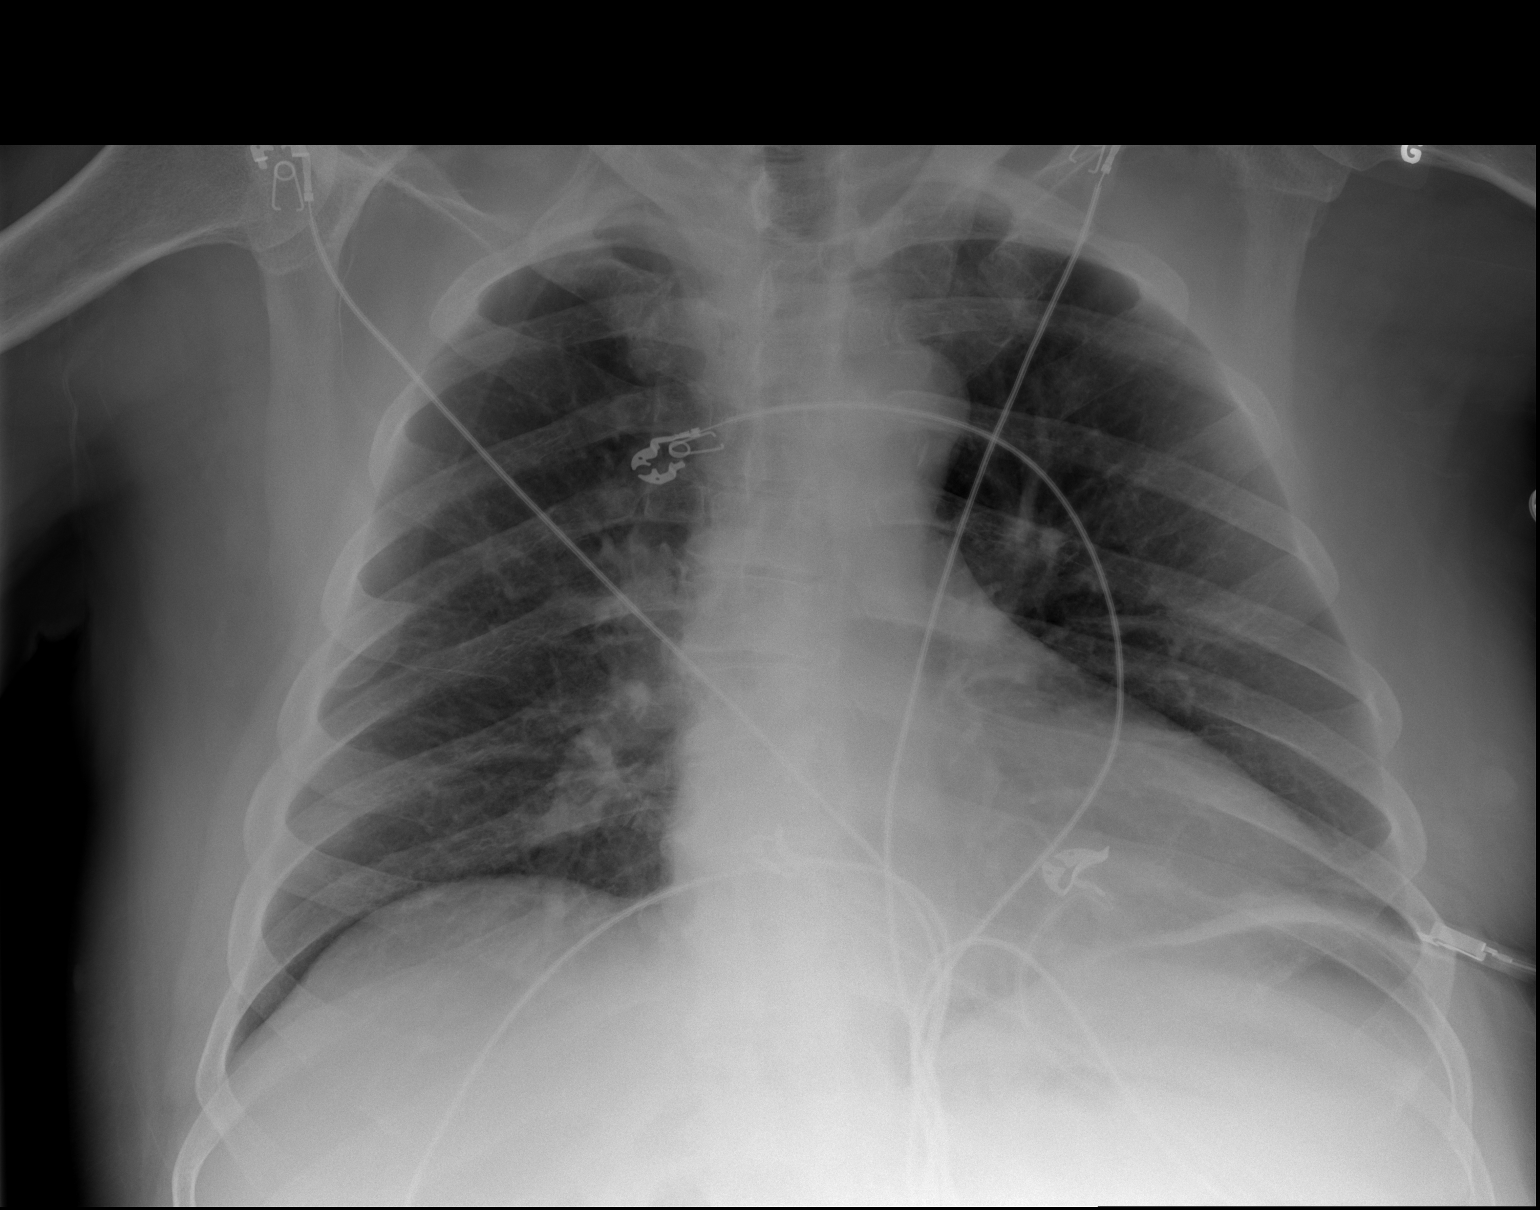

[2 of 2 positions shown; findings below may reference images not displayed]

FINDINGS: The heart size and mediastinal contours are within normal limits.
Both lungs are clear. No pneumothorax or pleural effusion is noted.
The visualized skeletal structures are unremarkable.
IMPRESSION: No acute cardiopulmonary abnormality seen.

## 2015-04-24 IMAGING — CT CT ABD-PELV W/ CM
1 of 3 series · 13 of 32 positions shown, 18 images · IV contrast (OMNIPAQUE 300)
Comparison: None available.

CLINICAL DATA: Initial valuation for lower abdominal pain since
05/30/2014. Nausea, vomiting. Elevated white blood cell count and
lipase.

EXAM:
CT ABDOMEN AND PELVIS WITH CONTRAST
TECHNIQUE: Multidetector CT imaging of the abdomen and pelvis was performed
using the standard protocol following bolus administration of
intravenous contrast.
CONTRAST:  100mL OMNIPAQUE IOHEXOL 300 MG/ML  SOLN

[Series 2: abd/pel with · axial · 0.84mm/px · z∈[-556,-111]mm · 13 of 99 slices shown, 18 images]
[im 5/99  soft-tissue]
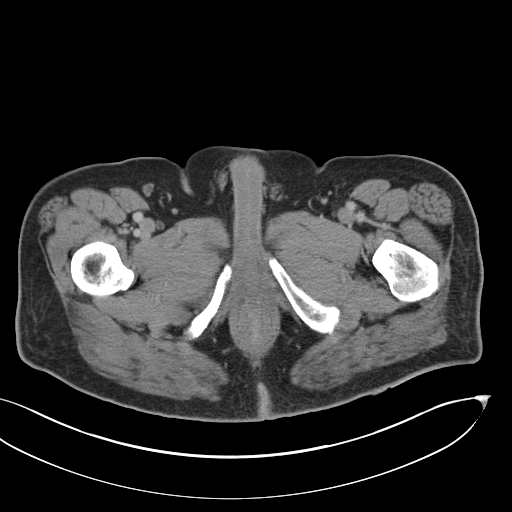
[im 5/99  bone]
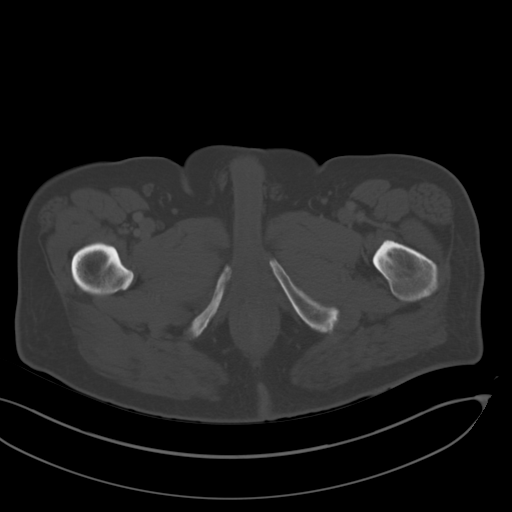
[im 15/99  soft-tissue]
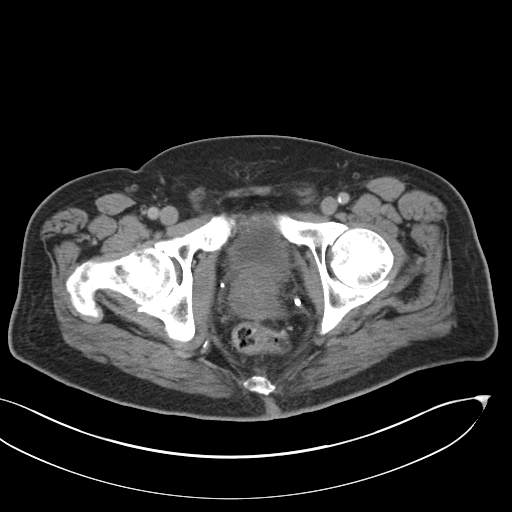
[im 20/99  soft-tissue]
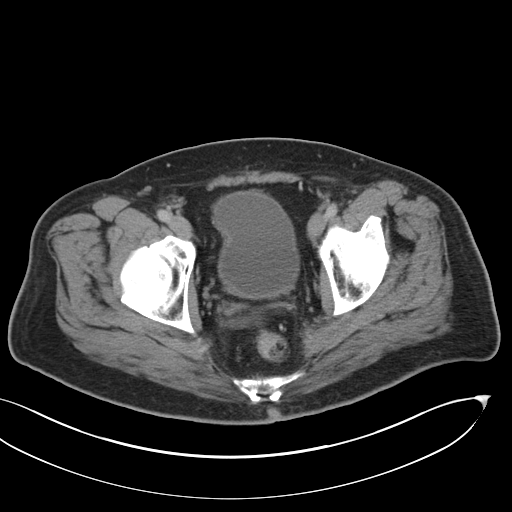
[im 30/99  soft-tissue]
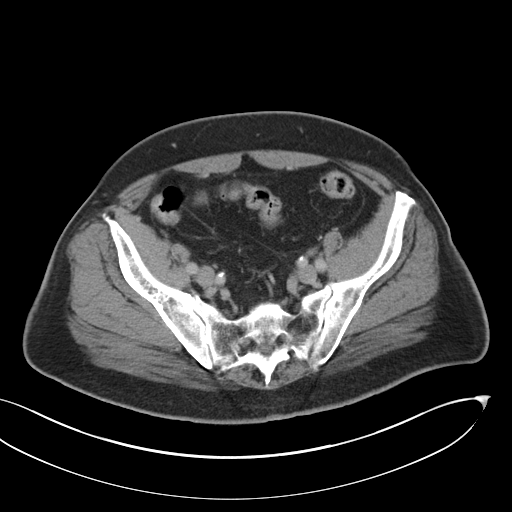
[im 40/99  soft-tissue]
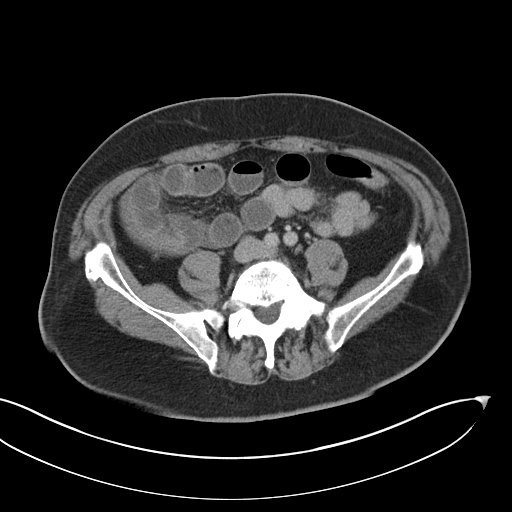
[im 45/99  soft-tissue]
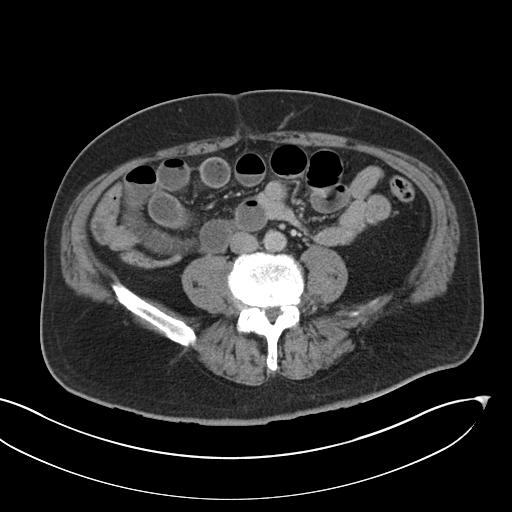
[im 54/99  soft-tissue]
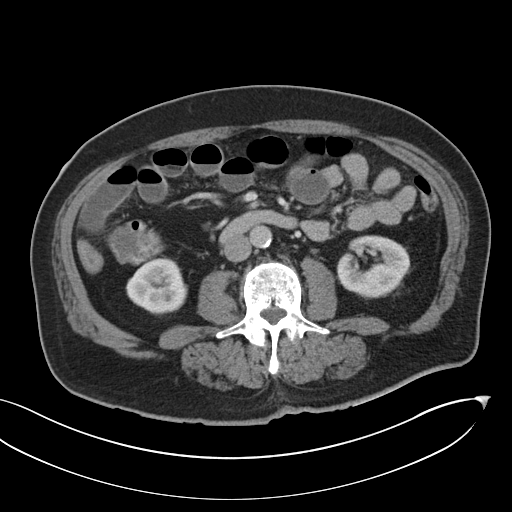
[im 59/99  soft-tissue]
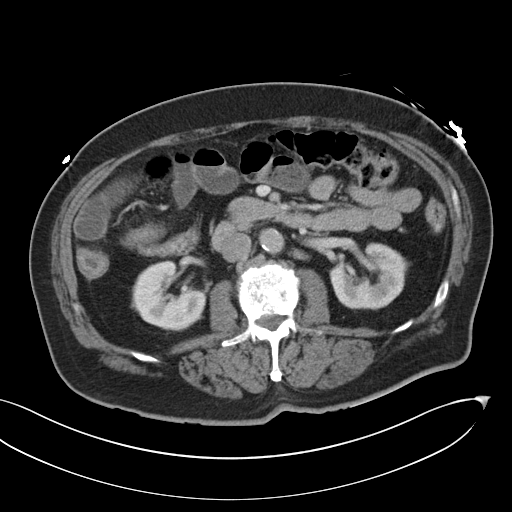
[im 69/99  soft-tissue]
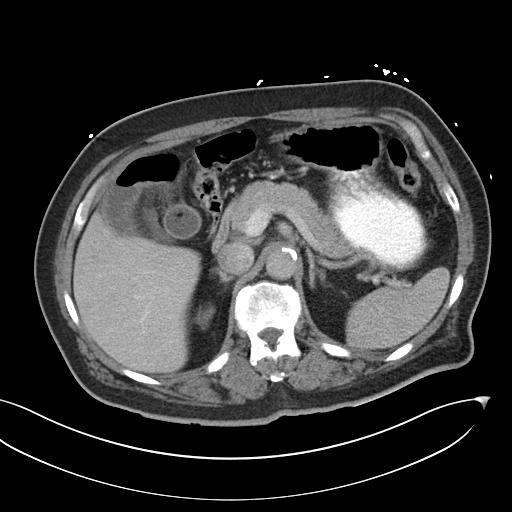
[im 69/99  bone]
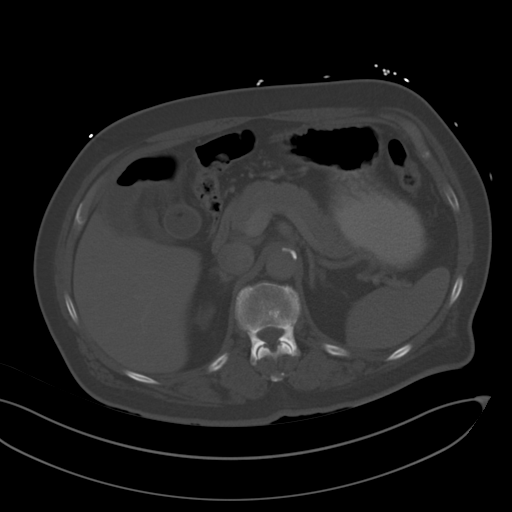
[im 79/99  soft-tissue]
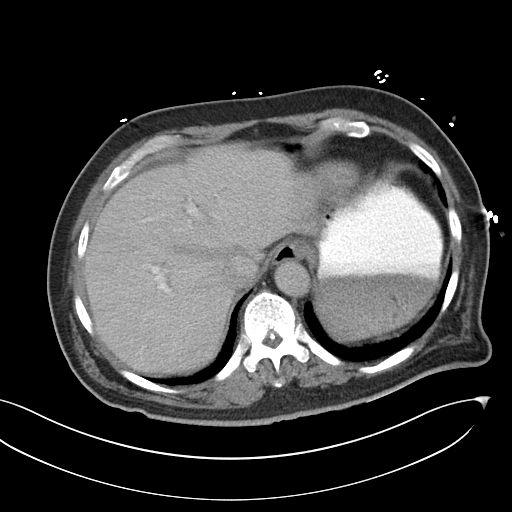
[im 79/99  lung]
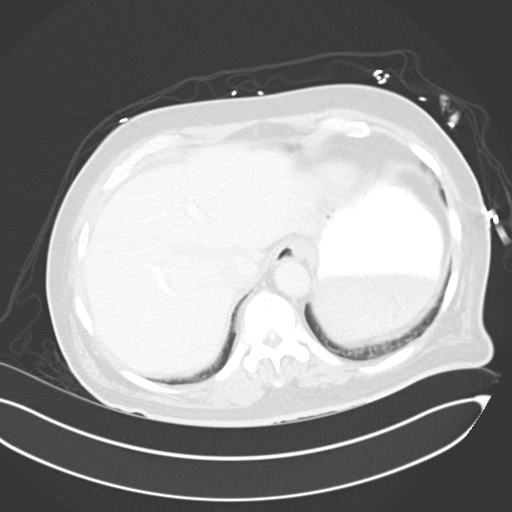
[im 84/99  soft-tissue]
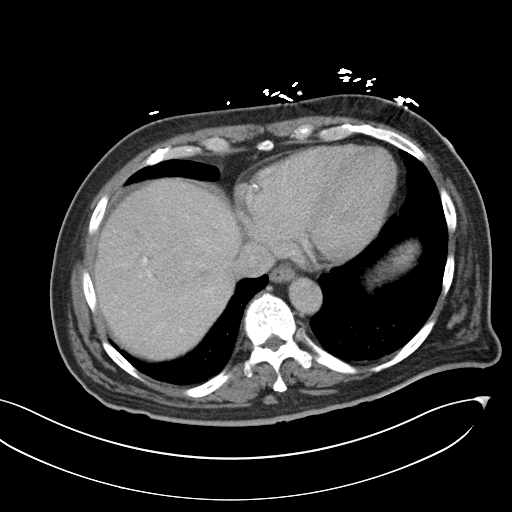
[im 84/99  lung]
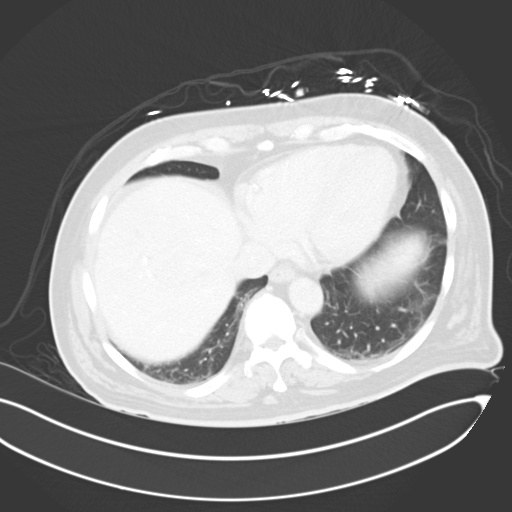
[im 89/99  lung]
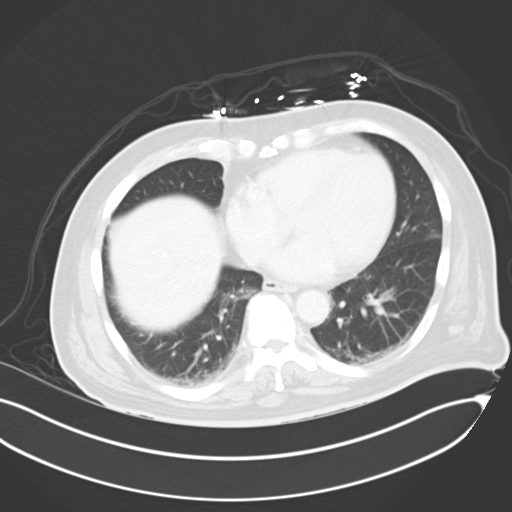
[im 94/99  soft-tissue]
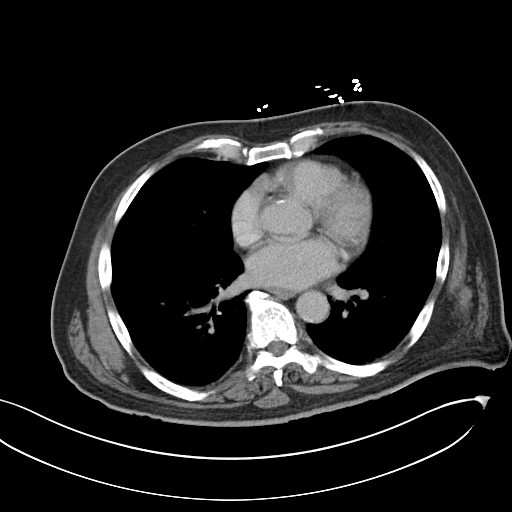
[im 94/99  lung]
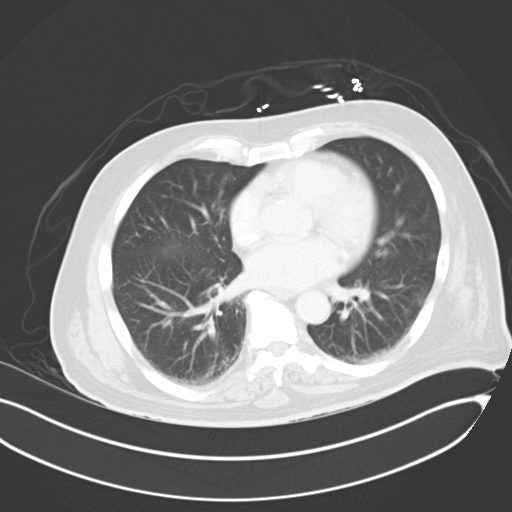

[13 of 32 positions shown; findings below may reference images not displayed]

FINDINGS: Subsegmental atelectasis seen dependently within the visualized lung
bases. No pleural or pericardial effusion.

The liver demonstrates a normal contrast enhanced appearance. There
is trace free perihepatic fluid. Gallbladder within normal limits.
No biliary dilatation. Spleen within normal limits. Trace free
perisplenic fluid present. Adrenal glands are unremarkable. Pancreas
demonstrates a normal appearance without evidence for acute
pancreatitis.

Kidneys are equal size with symmetric enhancement. No
nephrolithiasis, hydronephrosis, or focal enhancing renal mass.
Subcentimeter hypodense lesion within the right kidney is too small
the characterize, but statistically likely represents a small cyst.

Stomach within normal limits.

There are multiple mildly prominent fluid-filled loops of small
bowel clustered within the mid and lower right abdomen with
associated air-fluid levels. These measure up to 3.1 cm in diameter.
There is an apparent transition point within the right abdomen
(series 2, image 37). There is thickening and mucosal edema within
the ileum at this point. No definite mass lesion identified.

Appendix is normal.

The colon is within normal limits without acute inflammatory changes
or evidence of obstruction. Scattered colonic diverticula present
without acute diverticulitis.

Bladder within normal limits.  Prostate are unremarkable.

No free intraperitoneal air. No pathologically enlarged
intra-abdominal pelvic lymph nodes. Scattered calcified atheromatous
plaque present within the intra-abdominal aorta. No aneurysm.

No acute osseous abnormality. No worrisome lytic or blastic osseous
lesions. Scatter multilevel degenerative disc disease present within
the visualized spine.
IMPRESSION: 1. Findings concerning for small bowel obstruction with transition
point in the right abdomen. These findings may be secondary to an
acute ileitis with secondary obstruction as there is prominent wall
thickening with enhancement in the terminal ileum distal to the
transition point. No definite mass lesion or other abnormality to
explain mechanical obstruction. Followup to resolution is
recommended.
2. Trace free perihepatic and perisplenic fluid, likely reactive due
to the underlying obstructive process.
3. Colonic diverticulosis without acute diverticulitis.

## 2015-07-03 ENCOUNTER — Encounter: Payer: Self-pay | Admitting: Adult Health

## 2015-07-03 ENCOUNTER — Ambulatory Visit (INDEPENDENT_AMBULATORY_CARE_PROVIDER_SITE_OTHER): Payer: Medicare Other | Admitting: Adult Health

## 2015-07-03 VITALS — BP 184/91 | HR 63 | Ht 71.0 in | Wt 214.5 lb

## 2015-07-03 DIAGNOSIS — F0390 Unspecified dementia without behavioral disturbance: Secondary | ICD-10-CM | POA: Diagnosis not present

## 2015-07-03 NOTE — Progress Notes (Signed)
PATIENT: Darren Morgan DOB: April 14, 1941  REASON FOR VISIT: follow up- dementia HISTORY FROM: patient  HISTORY OF PRESENT ILLNESS: Darren Morgan is a 75 year old male with a history of dementia. He returns today for follow-up. He is only taking [name] and [name]. The patient feels that his memory has remained the same however his wife disagrees. He reports that he is able to complete all ADLs independently. He does not operate a motor vehicle. Wife states that she is currently doing the finances however before she was not. She states that the patient is sleeping okay however he does get up frequently to urinate. The patient has a good appetite. Denies any changes in his mood. The patient's brother recently moved in to help her care for him. Patient denies any new neurological symptoms. He returns today for an evaluation.  HISTORY 12/31/14: Darren Morgan is a 75 y.o. male here as a referral from Dr. Cornell Morgan, here in a revisit for dementia. [name] January 2015 documented global atrophy.   Interval history 12-31-14 : Darren Morgan , a 75 year old Caucasian right-handed gentleman presents today for revisit to test memory in the presence of his wife.  Early spring of this year in 2016 he suffered a bowel obstruction and had to be admitted to hospital for about 5 days. During this time and not now is orogastric suction tube was placed he was unable to take any oral medication or full. His wife noted immediately after this time a decline in cognitive function and it seems to have been a foster declined and his underlying cognitive disorder would suggest. A year ago when the patient was seen by Darren Morgan and he had been stable on medication.  This is no longer the case the Mini-Mental Status Examination for July 2016 shows a score of only 16 out of 30 points. The clock face could not be drawn the copying of an image was too interlocking pentagons was not copy correctly his animal fluency test was 14. There  has been no change in handwriting. The patient has very little facial expression. There have been behavioral changes after significant illness when ever any changes in the home, time planning, any variability from his Routines leads to anxiety, to panic, to confusion. He remains on an increased dose of donepezil well.  \He also continues to take [name] without problem. MMSE testing one year earlier  was at a score of 21/30 with deficits in delayed recall 0/3, errors in attention and calculation and orientation to time and place. Animal fluency testing is 12 today. Geriatric depression score 1 only.  The patient is sparse with words.  He has lost at least once control of bladder and bowel , had frequent urge incontinence.    REVIEW OF SYSTEMS: Out of a complete 14 system review of symptoms, the patient complains only of the following symptoms, and all other reviewed systems are negative.  See history of present illness  ALLERGIES: No Known Allergies  HOME MEDICATIONS: Outpatient Prescriptions Prior to Visit  Medication Sig Dispense Refill  . aspirin 81 MG tablet Take 81 mg by mouth daily.    Marland Kitchen donepezil ([name]) 23 MG TABS tablet Take 1 tablet (23 mg total) by mouth at bedtime. 90 tablet 1  . lisinopril (PRINIVIL,ZESTRIL) 10 MG tablet Take 10 mg by mouth daily.    . memantine ([name] XR) 28 MG CP24 24 hr capsule Take 1 capsule (28 mg total) by mouth daily. 90 capsule 1  . Multiple  Vitamins-Minerals (CENTRUM SILVER PO) Take by mouth daily.    . sildenafil (VIAGRA) 25 MG tablet Take 1 tablet (25 mg total) by mouth daily as needed for erectile dysfunction. 10 tablet 0  . metroNIDAZOLE (FLAGYL) 500 MG tablet Take 1 tablet (500 mg total) by mouth 3 (three) times daily. 21 tablet 0   No facility-administered medications prior to visit.    PAST MEDICAL HISTORY: Past Medical History  Diagnosis Date  . Memory loss   . Hypertension   . High cholesterol   . Dementia   . Dementia  12/07/2012  . 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 Atrial fibrillation (HCC) 09/27/2013    PAST SURGICAL HISTORY: Past Surgical History  Procedure Laterality Date  . Knee surgery Right     KNEE CAP REPLACED    FAMILY HISTORY: Family History  Problem Relation Age of Onset  . Alzheimer's disease Mother   . Alzheimer's disease Maternal Aunt     SOCIAL HISTORY: Social History   Social History  . Marital Status: Married    Spouse Name: Darren Morgan  . Number of Children: 1  . Years of Education: college   Occupational History  . RETIRED- printing    Social History Main Topics  . Smoking status: Former Games developer  . Smokeless tobacco: Never Used  . Alcohol Use: No  . Drug Use: No  . Sexual Activity: Not on file   Other Topics Concern  . Not on file   Social History Narrative   [name], right handed . retired. Lives at home with his wife of 32 years, Darren Morgan. They have one grown child. Has three sodas a day. Denies alcohol, tobacco and drug use. pt consumes tea ,diet cola once a day.               PHYSICAL EXAM  Filed Vitals:   07/03/15 1325  BP: 184/91  Pulse: 63  Height:  (1.803 m)  Weight: 214 lb 8 oz (97.297 kg)   Body mass index is 29.93 kg/(m^2).  MMSE - Mini Mental State Exam 07/03/2015  Orientation to time 2  Orientation to Place 3  Registration 3  Attention/ Calculation 2  Recall 2  Language- name 2 objects 2  Language- repeat 1  Language- follow 3 step command 3  Language- read & follow direction 1  Write a sentence 1  Copy design 0  Total score 20     Generalized: Well developed, in no acute distress. The patient shirt is dirty and the patient has a odor. Unsure that he is bathing consistently?  Neurological examination  Mentation: Alert.. Follows all commands speech and language fluent Cranial nerve II-XII: Pupils were equal round reactive to light. Extraocular movements were full, visual field were full on  confrontational test. Facial sensation and strength were normal. Uvula tongue midline. Head turning and shoulder shrug  were normal and symmetric. Motor: The motor testing reveals 5 over 5 strength of all 4 extremities. Good symmetric motor tone is noted throughout.  Sensory: Sensory testing is intact to soft touch on all 4 extremities. No evidence of extinction is noted.  Coordination: Cerebellar testing reveals good finger-nose-finger and heel-to-shin bilaterally.  Gait and station: Gait is normal. Tandem gait is normal. Romberg is negative. No drift is seen.  Reflexes: Deep tendon reflexes are symmetric and normal bilaterally.   DIAGNOSTIC DATA (LABS, IMAGING, TESTING) - I reviewed patient records, labs, notes, testing and imaging myself where available.  Lab Results  Component Value Date   WBC 14.3* 06/01/2014   HGB 14.6 06/01/2014   HCT 44.8 06/01/2014   MCV 98.0 06/01/2014   PLT 220 06/01/2014      Component Value Date/Time   NA 145 06/01/2014 0424   K 3.3* 06/01/2014 0424   CL 105 06/01/2014 0424   CO2 27 06/01/2014 0424   GLUCOSE 105* 06/01/2014 0424   BUN 18 06/01/2014 0424   CREATININE 1.25 06/01/2014 0424   CALCIUM 8.6 06/01/2014 0424   PROT 6.2 06/01/2014 0424   ALBUMIN 3.1* 06/01/2014 0424   AST 25 06/01/2014 0424   ALT 10 06/01/2014 0424   ALKPHOS 68 06/01/2014 0424   BILITOT 1.1 06/01/2014 0424   GFRNONAA 56* 06/01/2014 0424   GFRAA 65* 06/01/2014 0424       ASSESSMENT AND PLAN 75 y.o. 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 here with:  1. Dementia  The patient's memory score has remained the same. His MMSE is 20/30. The patient will remain on [name] and [name]. I do question if the patient is completing basic hygiene on a consistent basis. Patient and wife are  advised that if his symptoms worsen or he develops new symptoms he should let us know. He will follow-up in 6 months or sooner if needed.    Butch Penny, MSN, NP-C 07/03/2015, 1:45 PM [facility] [address] [place], [place] [address] [phone]

## 2015-07-03 NOTE — Progress Notes (Signed)
I agree with the assessment and plan as directed by NP .The patient is known to me .   Talar Fraley, MD  

## 2015-07-03 NOTE — Patient Instructions (Signed)
Continue [name] and [name]  Memory score is stable If your symptoms worsen or you develop new symptoms please let us know.   

## 2015-07-23 ENCOUNTER — Other Ambulatory Visit: Payer: Self-pay | Admitting: Neurology

## 2015-12-31 ENCOUNTER — Encounter: Payer: Self-pay | Admitting: Adult Health

## 2015-12-31 ENCOUNTER — Ambulatory Visit (INDEPENDENT_AMBULATORY_CARE_PROVIDER_SITE_OTHER): Payer: Medicare Other | Admitting: Adult Health

## 2015-12-31 VITALS — BP 175/84 | HR 61 | Ht 71.0 in | Wt 211.0 lb

## 2015-12-31 DIAGNOSIS — F0390 Unspecified dementia without behavioral disturbance: Secondary | ICD-10-CM | POA: Diagnosis not present

## 2015-12-31 NOTE — Progress Notes (Signed)
PATIENT: Darren Morgan DOB: [birth year]  REASON FOR VISIT: follow up-dementia HISTORY FROM: patient  HISTORY OF PRESENT ILLNESS: [name] is a 75 year old male with a history of dementia. He returns today for follow-up. He remains on [name] and [name]. He is tolerating the medication well. He continues to live at home with his wife. He requires assistance with ADLs. Wife states that she could use some help giving bath. She states that they only have a bathtub and it is hard bathing him. He no longer drives. Denies any trouble sleeping. He is eating well. No hallucinations or delusions. No tremor. Patient denies any new neurological symptoms. He returns today for an evaluation.  HISTORY 07/03/15: [name] is a 75 year old male with a history of dementia. He returns today for follow-up. He is only taking [name] and [name]. The patient feels that his memory has remained the same however his wife disagrees. He reports that he is able to complete all ADLs independently. He does not operate a motor vehicle. Wife states that she is currently doing the finances however before she was not. She states that the patient is sleeping okay however he does get up frequently to urinate. The patient has a good appetite. Denies any changes in his mood. The patient's brother recently moved in to help her care for him. Patient denies any new neurological symptoms. He returns today for an evaluation.  HISTORY 12/31/14: MILBERN DOESCHER is a 75 y.o. male here as a referral from [name], here in a revisit for dementia. [name] January 2015 documented global atrophy.   Interval history 12-31-14 : Mr. Bun , a 75 year old Caucasian right-handed gentleman presents today for revisit to test memory in the presence of his wife.  Early spring of this year in 2016 he suffered a bowel obstruction and had to be admitted to hospital for about 5 days. During this time and not now is orogastric suction tube was placed  he was unable to take any oral medication or full. His wife noted immediately after this time a decline in cognitive function and it seems to have been a foster declined and his underlying cognitive disorder would suggest. A year ago when the patient was seen by Heide Guile and he had been stable on medication.  This is no longer the case the Mini-Mental Status Examination for July 2016 shows a score of only 16 out of 30 points. The clock face could not be drawn the copying of an image was too interlocking pentagons was not copy correctly his animal fluency test was 14. There has been no change in handwriting. The patient has very little facial expression. There have been behavioral changes after significant illness when ever any changes in the home, time planning, any variability from his Routines leads to anxiety, to panic, to confusion. He remains on an increased dose of donepezil well.  \He also continues to take [name] without problem. MMSE testing one year earlier  was at a score of 21/30 with deficits in delayed recall 0/3, errors in attention and calculation and orientation to time and place. Animal fluency testing is 12 today. Geriatric depression score 1 only.  The patient is sparse with words.  He has lost at least once control of bladder and bowel , had frequent urge incontinence.    REVIEW OF SYSTEMS: Out of a complete 14 system review of symptoms, the patient complains only of the following symptoms, and all other reviewed systems are negative. See HPI  ALLERGIES: No Known Allergies  HOME MEDICATIONS: Outpatient Prescriptions Prior to Visit  Medication Sig Dispense Refill  . aspirin 81 MG tablet Take 81 mg by mouth daily.    Marland Kitchen. donepezil ([name]) 23 MG TABS tablet TAKE 1 TABLET BY MOUTH AT BEDTIME 90 tablet 1  . Multiple Vitamins-Minerals (CENTRUM SILVER PO) Take by mouth daily.    Marland Kitchen. [name] XR 28 MG CP24 24 hr capsule TAKE 1 CAPSULE BY MOUTH DAILY 90 capsule 1  .  sildenafil (VIAGRA) 25 MG tablet Take 1 tablet (25 mg total) by mouth daily as needed for erectile dysfunction. 10 tablet 0  . lisinopril (PRINIVIL,ZESTRIL) 10 MG tablet Take 10 mg by mouth daily.     No facility-administered medications prior to visit.    PAST MEDICAL HISTORY: Past Medical History  Diagnosis Date  . Memory loss   . Hypertension   . High cholesterol   . Dementia   . Dementia 12/07/2012  . 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 Atrial fibrillation (HCC) 09/27/2013    PAST SURGICAL HISTORY: Past Surgical History  Procedure Laterality Date  . Knee surgery Right     KNEE CAP REPLACED    FAMILY HISTORY: Family History  Problem Relation Age of Onset  . Alzheimer's disease Mother   . Alzheimer's disease Maternal Aunt     SOCIAL HISTORY: Social History   Social History  . Marital Status: Married    Spouse Name: joan  . Number of Children: 1  . Years of Education: college   Occupational History  . RETIRED- printing    Social History Main Topics  . Smoking status: Former Games developermoker  . Smokeless tobacco: Never Used  . Alcohol Use: No  . Drug Use: No  . Sexual Activity: Not on file   Other Topics Concern  . Not on file   Social History Narrative   [name], right handed . retired. Lives at home with his wife of 32 years, [name]. They have one grown child. Has three sodas a day. Denies alcohol, tobacco and drug use. pt consumes tea ,diet cola once a day.               PHYSICAL EXAM  Filed Vitals:   12/31/15 1404  BP: 175/84  Pulse: 61  Height: 5\' 11"  (1.803 m)  Weight: 211 lb (95.709 kg)   Body mass index is 29.44 kg/(m^2).  MMSE - Mini Mental State Exam 12/31/2015 07/03/2015  Orientation to time 1 2  Orientation to Place 4 3  Registration 3 3  Attention/ Calculation 4 2  Recall 1 2  Language- name 2 objects 2 2  Language- repeat 1 1  Language- follow 3 step command 3 3  Language- read & follow direction 1 1    Write a sentence 1 1  Copy design 0 0  Total score 21 20     Generalized: Well developed, in no acute distress   Neurological examination  Mentation: Alert. Follows all commands speech and language fluent Cranial nerve II-XII: Pupils were equal round reactive to light. Extraocular movements were full, visual field were full on confrontational test. Facial sensation and strength were normal. Uvula tongue midline. Head turning and shoulder shrug  were normal and symmetric. Motor: The motor testing reveals 5 over 5 strength of all 4 extremities. Good symmetric motor tone is noted throughout.  Sensory: Sensory testing is intact to soft touch on all 4 extremities. No evidence of extinction is noted.  Coordination:  Cerebellar testing reveals good finger-nose-finger and heel-to-shin bilaterally.  Gait and station: Gait is normal.  Reflexes: Deep tendon reflexes are symmetric and normal bilaterally.   DIAGNOSTIC DATA (LABS, IMAGING, TESTING) - I reviewed patient records, labs, notes, testing and imaging myself where available.   ASSESSMENT AND PLAN 75 y.o. 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 here with:  1. Dementia  Overall the patient's memory score has remained stable. He will continue on [name] and [name]. Home health referral was placed for help with hygiene needs. Advised that if his symptoms worsen or he develops any new symptoms he should let us know. Follow-up in 6 months with Dr. Vergia Alcon, MSN, NP-C 12/31/2015, 2:05 PM [facility] [address] [place], [place] [address] [phone]

## 2015-12-31 NOTE — Patient Instructions (Signed)
Continue [name] and [name]. If your symptoms worsen or you develop new symptoms please let us know.  

## 2015-12-31 NOTE — Progress Notes (Signed)
I agree with the assessment and plan as directed by NP .The patient is known to me .   Nichele Slawson, MD  

## 2016-01-09 ENCOUNTER — Other Ambulatory Visit: Payer: Self-pay | Admitting: Neurology

## 2016-07-06 ENCOUNTER — Ambulatory Visit (INDEPENDENT_AMBULATORY_CARE_PROVIDER_SITE_OTHER): Payer: Medicare Other | Admitting: Neurology

## 2016-07-06 ENCOUNTER — Encounter: Payer: Self-pay | Admitting: Neurology

## 2016-07-06 VITALS — BP 151/75 | HR 58 | Resp 20 | Ht 71.0 in | Wt 203.0 lb

## 2016-07-06 DIAGNOSIS — F02818 Dementia in other diseases classified elsewhere, unspecified severity, with other behavioral disturbance: Secondary | ICD-10-CM

## 2016-07-06 DIAGNOSIS — F0281 Dementia in other diseases classified elsewhere with behavioral disturbance: Secondary | ICD-10-CM | POA: Diagnosis not present

## 2016-07-06 DIAGNOSIS — G308 Other Alzheimer's disease: Secondary | ICD-10-CM

## 2016-07-06 MED ORDER — DONEPEZIL HCL 23 MG PO TABS: 23.0000 mg | ORAL_TABLET | Freq: Every day | ORAL | 1 refills | Status: DC

## 2016-07-06 MED ORDER — MEMANTINE HCL ER 28 MG PO CP24: 28.0000 mg | ORAL_CAPSULE | Freq: Every day | ORAL | 1 refills | Status: DC

## 2016-07-06 MED ORDER — TRAZODONE HCL 50 MG PO TABS: ORAL_TABLET | ORAL | 1 refills | Status: DC

## 2016-07-06 NOTE — Progress Notes (Signed)
PATIENT: Darren Morgan DOB: 1940-07-13  REASON FOR VISIT: follow up-dementia HISTORY FROM: patient  HISTORY OF PRESENT ILLNESS:   History from 07/06/2016, [name] is now 76 years old and seen here presence of his spouse. The couple of months ago he took the car to drive to a [employer] close to the couple's residence but arrived on the other side of [place]. Since then he has not been driving. Sadly Mr. Mrs. Darren Morgan lost their only child, an adult daughter age 13 unexpectedly. She died in September 27, 2015. She had suffered from diabetes and asthma but had felt sick for couple of days prior. She was found dead in her apartment. Mr. Morgan is not always oriented to the people that he sees infrequently. He is still participating in church activities and usually is accompanied by his wife or his brother-in-law. His last [name] in January 2015 had documented global atrophy of the brain a cognitive testing has shown a downward trend in the points. He is tested with an MMSE. He still continued to take [name] and [name]. He had become a horder and a sun downer. [name] took him to a visit at the zoo, while [employer] junk  cleaned out his man cave.   HISTORY 07/03/15: MM- [name] is a 76 year old male with a history of dementia. He returns today for follow-up. He is only taking [name] and [name]. The patient feels that his memory has remained the same however his wife disagrees. He reports that he is able to complete all ADLs independently. He does not operate a motor vehicle. Wife states that she is currently doing the finances however before she was not. She states that the patient is sleeping okay however he does get up frequently to urinate. The patient has a good appetite. Denies any changes in his mood. The patient's brother recently moved in to help her care for him. Patient denies any new neurological symptoms. He remains on [name] and [name]. He is tolerating the medication well. He  continues to live at home with his wife. He requires assistance with ADLs. Wife states that she could use some help giving bath. She states that they only have a bathtub and it is hard bathing him. He no longer drives. Denies any trouble sleeping. He is eating well. No hallucinations or delusions. No tremor. Patient denies any new neurological symptoms. He returns today for an evaluation.   HISTORY 12/31/14: [name] is a 76 y.o. male here as a referral from [name], here in a revisit for dementia. [name] January 2015 documented global atrophy.   Interval history 12-31-14 : [name] , a 76 year old Caucasian right-handed gentleman presents today for revisit to test memory in the presence of his wife.  Early spring of this year in 2016 he suffered a bowel obstruction and had to be admitted to hospital for about 5 days. During this time and not now is orogastric suction tube was placed he was unable to take any oral medication or full. His wife noted immediately after this time a decline in cognitive function and it seems to have been a foster declined and his underlying cognitive disorder would suggest. A year ago when the patient was seen by Heide Guile and he had been stable on medication.  This is no longer the case the Mini-Mental Status Examination for July 2016 shows a score of only 16 out of 30 points. The clock face could not be drawn the copying of an  image was too interlocking pentagons was not copy correctly his animal fluency test was 14. There has been no change in handwriting. The patient has very little facial expression. There have been behavioral changes after significant illness when ever any changes in the home, time planning, any variability from his Routines leads to anxiety, to panic, to confusion. He remains on an increased dose of donepezil well.  \He also continues to take [name] without problem. MMSE testing one year earlier  was at a score of 21/30 with  deficits in delayed recall 0/3, errors in attention and calculation and orientation to time and place. Animal fluency testing is 12 today. Geriatric depression score 1 only.  The patient is sparse with words.  He has lost at least once control of bladder and bowel , had frequent urge incontinence.    REVIEW OF SYSTEMS: Out of a complete 14 system review of symptoms, the patient complains only of the following symptoms, and all other reviewed systems are negative. See HPI grief   ALLERGIES: No Known Allergies  HOME MEDICATIONS: Outpatient Medications Prior to Visit  Medication Sig Dispense Refill  . aspirin 81 MG tablet Take 81 mg by mouth daily.    Marland Kitchen donepezil ([name]) 23 MG TABS tablet take 1 tablet by mouth at bedtime 90 tablet 1  . lisinopril (PRINIVIL,ZESTRIL) 40 MG tablet   0  . Multiple Vitamins-Minerals (CENTRUM SILVER PO) Take by mouth daily.    Marland Kitchen [name] XR 28 MG CP24 24 hr capsule take 1 capsule by mouth daily 90 capsule 1  . sildenafil (VIAGRA) 25 MG tablet Take 1 tablet (25 mg total) by mouth daily as needed for erectile dysfunction. 10 tablet 0   No facility-administered medications prior to visit.     PAST MEDICAL HISTORY: Past Medical History:  Diagnosis Date  . 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 Atrial fibrillation (HCC) 09/27/2013  . Dementia   . Dementia 12/07/2012  . High cholesterol   . Hypertension   . Memory loss     PAST SURGICAL HISTORY: Past Surgical History:  Procedure Laterality Date  . KNEE SURGERY Right    KNEE CAP REPLACED    FAMILY HISTORY: Family History  Problem Relation Age of Onset  . Alzheimer's disease Mother   . Alzheimer's disease Maternal Aunt     SOCIAL HISTORY: Social History   Social History  . Marital status: Married    Spouse name: joan  . Number of children: 1  . Years of education: college   Occupational History  . RETIRED- printing    Social History Main Topics  . Smoking  status: Former Games developer  . Smokeless tobacco: Never Used  . Alcohol use No  . Drug use: No  . Sexual activity: Not on file   Other Topics Concern  . Not on file   Social History Narrative   [name], right handed . retired. Lives at home with his wife of 32 years, [name]. They have one grown child. Has three sodas a day. Denies alcohol, tobacco and drug use. pt consumes tea ,diet cola once a day.               PHYSICAL EXAM  Vitals:   07/06/16 1024  BP: (!) 151/75  Pulse: (!) 58  Resp: 20  Weight: 203 lb (92.1 kg)  Height: 5\' 11"  (1.803 m)    MMSE - Mini Mental State Exam 07/06/2016 12/31/2015 07/03/2015  Orientation to time 1 1 2  Orientation to Place 1 4 3   Registration 3 3 3   Attention/ Calculation 2 4 2   Recall 0 1 2  Language- name 2 objects 2 2 2   Language- repeat 1 1 1   Language- follow 3 step command 3 3 3   Language- read & follow direction 1 1 1   Write a sentence 1 1 1   Copy design 0 0 0  Total score 15 21 20       Generalized: Well developed, in no acute distress   Neurological examination  Mentation: Alert. Follows all commands speech and language fluent. Very repetitive, and he has mistaken siblings of his for his parents , etc.  Cranial nerve : Pupils were equal round reactive to light. Extraocular movements were full, visual field were full on confrontational test. Facial sensation and strength were normal. Uvula tongue midline. Head turning and shoulder shrug  were normal and symmetric. Motor: 5/5 strength in 4 extremities. Good symmetric motor tone is noted throughout.  Sensory: intact to soft touch on all 4 extremities. No evidence of extinction is noted.  Coordination:  finger-nose-finger and heel-to-shin intact bilaterally.  Gait and station: Gait is normal.  Reflexes: Deep tendon reflexes are symmetric and normal bilaterally.   DIAGNOSTIC DATA (LABS, IMAGING, TESTING) - I reviewed patient records, labs, notes, testing and imaging myself  where available.   ASSESSMENT AND PLAN 76 y.o. 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 here with:  1. Dementia arising in senium, with progressive decline.  2. He frequents the bathroom at night, wanders , but he doesn't urinate.  3. I will try Trazodone to see if it helps him sleep better, and allows his wife to sleep.   Overall the patient's memory score has declined . He will continue on [name] and [name].  Home health referral was placed for help with hygiene needs- this was a financial issue, medicare would not cover for bathing/ toiletting/ shaving only. .   The patient's brother in law moved in to help with his care.  He now used an Neurosurgeonelectric razor and a shower chair, velcro closure shoes. He dresses with assistance. He needs to be told step by step.   Mr. Hyacinth MeekerMiller no longer drives. The couple lost their daughter, aged 75, in April 2017, unexpected - after an asthma attack, found dead in her bathroom and the couple  didn't celebrate Christmas this time, grieving.  Advised that if his symptoms worsen or he develops any new symptoms he should let us know. Follow-up in 6 months with NP, MMSE/ MOCA.   07/06/2016, 10:29 AM [facility] [address], [address] [place], [place] [address] [phone]

## 2016-07-06 NOTE — Patient Instructions (Signed)
Alzheimer Disease Caregiver Guide A person who has Alzheimer disease may not be able to take care of himself or herself. He or she may need help with simple tasks. The tips below can help you care for the person. Memory loss and confusion If the person is confused or cannot remember things:  Stay calm.  Respond with a short answer.  Avoid correcting him or her in a way that sounds like scolding.  Try not to take it personally, even if he or she forgets your name.  Behavior changes The person may go through behavior changes. This can include depression, anxiety, anger, or seeing things that are not there. When behavior changes:  Try not to take behavior changes personally.  Stay calm and patient.  Do not argue or try to convince the person about a specific point.  Know that these changes are part of the disease process. Try to work through it.  Tips to lessen frustration  Make appointments and do daily tasks when the person is at his or her best.  Take your time. Simple tasks may take longer. Allow plenty of time to complete tasks.  Limit choices for the person.  Involve the person in what you are doing.  Stick to a routine.  Avoid new or crowded places, if possible.  Use simple words, short sentences, and a calm voice. Only give 1 direction at a time.  Buy clothes and shoes that are easy to put on and take off.  Let people help if they offer. Home safety  Keep floors clear. Remove rugs, magazine racks, and floor lamps.  Keep hallways well lit.  Put a handrail and nonslip mat in the bathtub or shower.  Put childproof locks on cabinets that have dangerous items in them. These items include medicine, alcohol, guns, toxic cleaning items, sharp tools, matches, or lighters.  Place locks on doors where the person cannot see or reach them. This helps the person to not wander out of the house and get lost.  Be prepared for emergencies. Keep a list of emergency phone  numbers and addresses in a handy area. Plans for the future  Talk about finances. ? Talk about money management. People with Alzheimer disease have trouble managing their money as the disease gets worse. ? Get help from professional advisors about financial and legal matters.  Talk about future care. ? Choose a power of attorney. This is someone who can make decisions for the person with Alzheimer disease when he or she can no longer do so. ? Talk about driving and when it is the right time to stop. The person's doctor can help with this. ? If the person lives alone, make sure he or she is safe. Some people need extra help at home. Other people need more care at a nursing home or care center. Support groups Some benefits of joining a support group include:  Learning ways to manage stress.  Sharing experiences with others.  Getting emotional comfort and support.  Learning new caregiving skills as the disease progresses.  Knowing what community resources are available and taking advantage of them.  Get help if:  The person has a fever.  The person has a sudden behavior change that does not get better with calming strategies.  The person is unable to manage his or her living situation.  The person threatens you or anyone else, including himself or herself.  You are no longer able to care for the person. This information is not   intended to replace advice given to you by your health care provider. Make sure you discuss any questions you have with your health care provider. Document Released: 08/24/2011 Document Revised: 11/07/2015 Document Reviewed: 07/22/2011 Elsevier Interactive Patient Education  2017 Elsevier Inc.  

## 2016-07-13 ENCOUNTER — Other Ambulatory Visit: Payer: Self-pay

## 2016-07-13 DIAGNOSIS — G308 Other Alzheimer's disease: Principal | ICD-10-CM

## 2016-07-13 DIAGNOSIS — F02818 Dementia in other diseases classified elsewhere, unspecified severity, with other behavioral disturbance: Secondary | ICD-10-CM

## 2016-07-13 DIAGNOSIS — F0281 Dementia in other diseases classified elsewhere with behavioral disturbance: Secondary | ICD-10-CM

## 2016-07-13 MED ORDER — MEMANTINE HCL ER 28 MG PO CP24: 28.0000 mg | ORAL_CAPSULE | Freq: Every day | ORAL | 1 refills | Status: DC

## 2017-01-05 ENCOUNTER — Encounter: Payer: Self-pay | Admitting: Adult Health

## 2017-01-05 ENCOUNTER — Ambulatory Visit (INDEPENDENT_AMBULATORY_CARE_PROVIDER_SITE_OTHER): Payer: Medicare Other | Admitting: Adult Health

## 2017-01-05 DIAGNOSIS — G308 Other Alzheimer's disease: Secondary | ICD-10-CM

## 2017-01-05 DIAGNOSIS — F0281 Dementia in other diseases classified elsewhere with behavioral disturbance: Secondary | ICD-10-CM

## 2017-01-05 MED ORDER — DONEPEZIL HCL 23 MG PO TABS: 23.0000 mg | ORAL_TABLET | Freq: Every day | ORAL | 3 refills | Status: DC

## 2017-01-05 MED ORDER — MEMANTINE HCL ER 28 MG PO CP24: 28.0000 mg | ORAL_CAPSULE | Freq: Every day | ORAL | 3 refills | Status: DC

## 2017-01-05 MED ORDER — TRAZODONE HCL 50 MG PO TABS: 50.0000 mg | ORAL_TABLET | Freq: Every day | ORAL | 5 refills | Status: DC

## 2017-01-05 NOTE — Progress Notes (Signed)
PATIENT: Darren Morgan DOB: Jun 28, 1940  REASON FOR VISIT: follow up- Alzheimer's disease HISTORY FROM: patient  HISTORY OF PRESENT ILLNESS: Darren Morgan is a 76 year old male with a history of Alzheimer's disease. He returns today for follow-up. He continues on [name] and [name]. Wife reports that he continues to tolerate these medications well. She has noticed a steady decline in his memory. She states that he requires assistance with all ADLs. 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 She states in the past she used to do word searches and no longer does this but will rather just watch TV. She states that there is more confusion at night. She states he tends to wander off. For that reason they have locks on the doors that he cannot get out. She states occasionally he will have some hallucinations at night however she denies vioent or fearful hallucinations . She denies any changes with his mood or behavior. She states that she has noticed a decrease in appetite unless it is "sweets." His wife states that trazodone has been beneficial however she feels that if he is able to take the whole tablet he would stay asleep throughout the night. Patient returns today for an evaluation.  HISTORY Darren Morgan is a 76 year old male with a history of dementia. He returns today for follow-up. He remains on [name] and [name]. He is tolerating the medication well. He continues to live at home with his wife. He requires assistance with ADLs. Wife states that she could use some help giving bath. She states that they only have a bathtub and it is hard bathing him. He no longer drives. Denies any trouble sleeping. He is eating well. No hallucinations or delusions. No tremor. Patient denies any new neurological symptoms. He returns today for an evaluation.   REVIEW OF SYSTEMS: Out of a complete 14 system review of symptoms, the patient complains only of the following symptoms, and all other reviewed systems are  negative.  Incontinence of bladder, memory loss, frequent waking  ALLERGIES: No Known Allergies  HOME MEDICATIONS: Outpatient Medications Prior to Visit  Medication Sig Dispense Refill  . aspirin 81 MG tablet Take 81 mg by mouth daily.    Marland Kitchen CARTIA XT 240 MG 24 hr capsule Take 240 mg by mouth daily.    Marland Kitchen donepezil ([name]) 23 MG TABS tablet Take 1 tablet (23 mg total) by mouth at bedtime. 90 tablet 1  . lisinopril (PRINIVIL,ZESTRIL) 40 MG tablet   0  . memantine ([name] XR) 28 MG CP24 24 hr capsule Take 1 capsule (28 mg total) by mouth daily. 90 capsule 1  . Multiple Vitamins-Minerals (CENTRUM SILVER PO) Take by mouth daily.    . traZODone (DESYREL) 50 MG tablet 1/2 tab at night.  Take aricept in AM. 45 tablet 1  . sildenafil (VIAGRA) 25 MG tablet Take 1 tablet (25 mg total) by mouth daily as needed for erectile dysfunction. (Patient not taking: Reported on 01/05/2017) 10 tablet 0   No facility-administered medications prior to visit.     PAST MEDICAL HISTORY: Past Medical History:  Diagnosis Date  . 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 Atrial fibrillation (HCC) 09/27/2013  . Dementia   . Dementia 12/07/2012  . High cholesterol   . Hypertension   . Memory loss     PAST SURGICAL HISTORY: Past Surgical History:  Procedure Laterality Date  . KNEE SURGERY Right    KNEE CAP REPLACED    FAMILY HISTORY: Family History  Problem Relation Age of Onset  . Alzheimer's disease Mother   . Alzheimer's disease Maternal Aunt     SOCIAL HISTORY: Social History   Social History  . Marital status: Married    Spouse name: joan  . Number of children: 1  . Years of education: college   Occupational History  . RETIRED- printing    Social History Main Topics  . Smoking status: Former Games developer  . Smokeless tobacco: Never Used  . Alcohol use No  . Drug use: No  . Sexual activity: Not on file   Other Topics Concern  . Not on file   Social History  Narrative   [name], right handed . retired. Lives at home with his wife of 32 years, [name]. They have one grown child. Has three sodas a day. Denies alcohol, tobacco and drug use. pt consumes tea ,diet cola once a day.               PHYSICAL EXAM  Vitals:   01/05/17 1045  BP: 118/78  Pulse: (!) 54  Weight: 188 lb 6.4 oz (85.5 kg)   Body mass index is 26.28 kg/m. MMSE - Mini Mental State Exam 01/05/2017 07/06/2016 12/31/2015  Orientation to time 0 1 1  Orientation to Place 2 1 4   Registration 3 3 3   Attention/ Calculation 0 2 4  Recall 0 0 1  Language- name 2 objects 2 2 2   Language- repeat 0 1 1  Language- follow 3 step command 2 3 3   Language- read & follow direction 1 1 1   Write a sentence 1 1 1   Copy design 0 0 0  Total score 11 15 21     Generalized: Well developed, in no acute distress   Neurological examination  Mentation: Alert oriented to time, place, history taking. Follows all commands speech and language fluent Cranial nerve II-XII: Pupils were equal round reactive to light. Extraocular movements were full, visual field were full on confrontational test. Facial sensation and strength were normal. Uvula tongue midline. Head turning and shoulder shrug  were normal and symmetric. Motor: The motor testing reveals 5 over 5 strength of all 4 extremities. Good symmetric motor tone is noted throughout.  Sensory: Sensory testing is intact to soft touch on all 4 extremities. No evidence of extinction is noted.  Coordination: Cerebellar testing reveals good finger-nose-finger. Ataxia with heel-to-shin bilaterally. Gait and station: Gait is normal. Tandem gait not attempted Reflexes: Deep tendon reflexes are symmetric and normal bilaterally.   DIAGNOSTIC DATA (LABS, IMAGING, TESTING) - I reviewed patient records, labs, notes, testing and imaging myself where available.  Lab Results  Component Value Date   WBC 14.3 (H) 06/01/2014   HGB 14.6 06/01/2014   HCT 44.8  06/01/2014   MCV 98.0 06/01/2014   PLT 220 06/01/2014      Component Value Date/Time   NA 145 06/01/2014 0424   K 3.3 (L) 06/01/2014 0424   CL 105 06/01/2014 0424   CO2 27 06/01/2014 0424   GLUCOSE 105 (H) 06/01/2014 0424   BUN 18 06/01/2014 0424   CREATININE 1.25 06/01/2014 0424   CALCIUM 8.6 06/01/2014 0424   PROT 6.2 06/01/2014 0424   ALBUMIN 3.1 (L) 06/01/2014 0424   AST 25 06/01/2014 0424   ALT 10 06/01/2014 0424   ALKPHOS 68 06/01/2014 0424   BILITOT 1.1 06/01/2014 0424   GFRNONAA 56 (L) 06/01/2014 0424   GFRAA 65 (L) 06/01/2014 0424       ASSESSMENT AND PLAN  76 y.o. 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 here with:  1. Alzheimer's disease  2. Sleep disturbance  The patient's memory score has declined slightly. He will continue on [name] and [name]. We will increase trazodone to 50 mg at bedtime. Patient and his wife advised that if his symptoms worsen or he develops new symptoms they should let us know. He will follow-up in 6 months or sooner if needed.  I spent 15 minutes with the patient. 50% of this time was spent reviewing memory score   Butch PennyMegan Velma Hanna, MSN, NP-C 01/05/2017, 11:01 AM [facility] [address], [address] [place], [place] [address] [phone]

## 2017-01-05 NOTE — Progress Notes (Signed)
I agree with the assessment and plan as directed by NP . I was available for consultation.   Baylee Mccorkel, MD  

## 2017-01-05 NOTE — Patient Instructions (Signed)
Your Plan:  Continue [name] and [name] Increase Trazodone to 50 mg at bedtime If your symptoms worsen or you develop new symptoms please let us know.   Thank you for coming to see [facility] at [facility]. I hope we have been able to provide you high quality care today.  You may receive a patient satisfaction survey over the next few weeks. We would appreciate your feedback and comments so that we may continue to improve ourselves and the health of our patients.

## 2017-07-08 ENCOUNTER — Encounter: Payer: Self-pay | Admitting: Adult Health

## 2017-07-08 ENCOUNTER — Ambulatory Visit: Payer: Medicare Other | Admitting: Adult Health

## 2017-07-08 VITALS — BP 176/85 | HR 56 | Ht 71.0 in | Wt 183.4 lb

## 2017-07-08 DIAGNOSIS — F0280 Dementia in other diseases classified elsewhere without behavioral disturbance: Secondary | ICD-10-CM | POA: Diagnosis not present

## 2017-07-08 DIAGNOSIS — G309 Alzheimer's disease, unspecified: Secondary | ICD-10-CM | POA: Diagnosis not present

## 2017-07-08 NOTE — Patient Instructions (Signed)
Your Plan:  Continue [name] and [name] If your symptoms worsen or you develop new symptoms please let us know.   Thank you for coming to see us at [facility]. I hope we have been able to provide you high quality care today.  You may receive a patient satisfaction survey over the next few weeks. We would appreciate your feedback and comments so that we may continue to improve ourselves and the health of our patients.  

## 2017-07-08 NOTE — Progress Notes (Signed)
PATIENT: Darren Morgan DOB: [birth year]  REASON FOR VISIT: follow up HISTORY FROM: patient  HISTORY OF PRESENT ILLNESS: Today 07/08/17 Darren Morgan is a 77 year old male with a history of Alzheimer's disease.  He returns today for follow-up.  He remains on [name] and [name].  He reports that he is tolerating the medication well.  His wife reports that he does require assistance with all ADLs.  He does not operate a motor vehicle.  Denies any trouble sleeping.  Denies any significant agitation or aggressiveness.  Denies hallucinations.  His wife reports that he does tend to wander at night but she put locs on the door so he cannot leave.  They deny any new neurological symptoms.  Returns today for an evaluation.  Darren Morgan is a 77 year old male with a history of Alzheimer's disease. He returns today for follow-up. He continues on [name] and [name]. Wife reports that he continues to tolerate these medications well. She has noticed a steady decline in his memory. She states that he requires assistance with all ADLs. 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 She states in the past she used to do word searches and no longer does this but will rather just watch TV. She states that there is more confusion at night. She states he tends to wander off. For that reason they have locks on the doors that he cannot get out. She states occasionally he will have some hallucinations at night however she denies vioent or fearful hallucinations . She denies any changes with his mood or behavior. She states that she has noticed a decrease in appetite unless it is "sweets." His wife states that trazodone has been beneficial however she feels that if he is able to take the whole tablet he would stay asleep throughout the night. Patient returns today for an evaluation.   REVIEW OF SYSTEMS: Out of a complete 14 system review of symptoms, the patient complains only of the following symptoms, and all other reviewed  systems are negative.  ALLERGIES: No Known Allergies  HOME MEDICATIONS: Outpatient Medications Prior to Visit  Medication Sig Dispense Refill  . aspirin 81 MG tablet Take 81 mg by mouth daily.    Marland Kitchen CARTIA XT 240 MG 24 hr capsule Take 240 mg by mouth daily.    Marland Kitchen donepezil ([name]) 23 MG TABS tablet Take 1 tablet (23 mg total) by mouth at bedtime. 90 tablet 3  . lisinopril (PRINIVIL,ZESTRIL) 40 MG tablet   0  . memantine ([name] XR) 28 MG CP24 24 hr capsule Take 1 capsule (28 mg total) by mouth daily. 90 capsule 3  . Multiple Vitamins-Minerals (CENTRUM SILVER PO) Take by mouth daily.    . traZODone (DESYREL) 50 MG tablet Take 1 tablet (50 mg total) by mouth at bedtime. 30 tablet 5   No facility-administered medications prior to visit.     PAST MEDICAL HISTORY: Past Medical History:  Diagnosis Date  . 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 Atrial fibrillation (HCC) 09/27/2013  . Dementia   . Dementia 12/07/2012  . High cholesterol   . Hypertension   . Memory loss     PAST SURGICAL HISTORY: Past Surgical History:  Procedure Laterality Date  . KNEE SURGERY Right    KNEE CAP REPLACED    FAMILY HISTORY: Family History  Problem Relation Age of Onset  . Alzheimer's disease Mother   . Alzheimer's disease Maternal Aunt     SOCIAL HISTORY: Social History  Socioeconomic History  . Marital status: Married    Spouse name: joan  . Number of children: 1  . Years of education: college  . Highest education level: Not on file  Social Needs  . Financial resource strain: Not on file  . Food insecurity - worry: Not on file  . Food insecurity - inability: Not on file  . Transportation needs - medical: Not on file  . Transportation needs - non-medical: Not on file  Occupational History  . Occupation: RETIRED- printing  Tobacco Use  . Smoking status: Former Games developer  . Smokeless tobacco: Never Used  Substance and Sexual Activity  . Alcohol use: No  .  Drug use: No  . Sexual activity: Not on file  Other Topics Concern  . Not on file  Social History Narrative   [name], right handed . retired. Lives at home with his wife of 32 years, [name]. They have one grown child. Has three sodas a day. Denies alcohol, tobacco and drug use. pt consumes tea ,diet cola once a day.            PHYSICAL EXAM  Vitals:   07/08/17 1409  BP: (!) 176/85  Pulse: (!) 56  Weight: 183 lb 6.4 oz (83.2 kg)  Height: 5\' 11"  (1.803 m)   Body mass index is 25.58 kg/m.   MMSE - Mini Mental State Exam 01/05/2017 07/06/2016 12/31/2015  Orientation to time 0 1 1  Orientation to Place 2 1 4   Registration 3 3 3   Attention/ Calculation 0 2 4  Recall 0 0 1  Language- name 2 objects 2 2 2   Language- repeat 0 1 1  Language- follow 3 step command 2 3 3   Language- read & follow direction 1 1 1   Write a sentence 1 1 1   Copy design 0 0 0  Total score 11 15 21      Generalized: Well developed, in no acute distress   Neurological examination  Mentation: Alert oriented to time, place, history taking. Follows all commands speech and language fluent Cranial nerve II-XII: Pupils were equal round reactive to light. Extraocular movements were full, visual field were full on confrontational test. Facial sensation and strength were normal. Uvula tongue midline. Head turning and shoulder shrug  were normal and symmetric. Motor: The motor testing reveals 5 over 5 strength of all 4 extremities. Good symmetric motor tone is noted throughout.  Sensory: Sensory testing is intact to soft touch on all 4 extremities. No evidence of extinction is noted.  Coordination: Cerebellar testing reveals good finger-nose-finger and heel-to-shin bilaterally.  Gait and station: Gait is normal. Tandem gait is normal. Romberg is negative. No drift is seen.  Reflexes: Deep tendon reflexes are symmetric and normal bilaterally.   DIAGNOSTIC DATA (LABS, IMAGING, TESTING) - I reviewed patient  records, labs, notes, testing and imaging myself where available.  Lab Results  Component Value Date   WBC 14.3 (H) 06/01/2014   HGB 14.6 06/01/2014   HCT 44.8 06/01/2014   MCV 98.0 06/01/2014   PLT 220 06/01/2014      Component Value Date/Time   NA 145 06/01/2014 0424   K 3.3 (L) 06/01/2014 0424   CL 105 06/01/2014 0424   CO2 27 06/01/2014 0424   GLUCOSE 105 (H) 06/01/2014 0424   BUN 18 06/01/2014 0424   CREATININE 1.25 06/01/2014 0424   CALCIUM 8.6 06/01/2014 0424   PROT 6.2 06/01/2014 0424   ALBUMIN 3.1 (L) 06/01/2014 0424   AST 25 06/01/2014  0424   ALT 10 06/01/2014 0424   ALKPHOS 68 06/01/2014 0424   BILITOT 1.1 06/01/2014 0424   GFRNONAA 56 (L) 06/01/2014 0424   GFRAA 65 (L) 06/01/2014 0424   Lab Results  Component Value Date   TSH 0.538 05/31/2014      ASSESSMENT AND PLAN 77 y.o. 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 here with:  1.  Dementia  The patient will remain on [name] and [name] for his memory.  I did give the patient's wife information to care patrol to use as a potential resource if more assistance is needed in the future.  Advised that if his symptoms worsen or he develops new symptoms he should let us know.  He will follow-up in 6 months or sooner if needed.  I spent 15 minutes with the patient. 50% of this time was spent reviewing medication   Butch PennyMegan Lajoya Dombek, MSN, NP-C 07/08/2017, 2:38 PM [facility] [address], [address] [place], [place] [address] [phone]

## 2017-07-08 NOTE — Progress Notes (Signed)
I have read the note, and I agree with the clinical assessment and plan.  Ranulfo K Lisamarie Coke   

## 2017-07-13 ENCOUNTER — Other Ambulatory Visit: Payer: Self-pay | Admitting: Adult Health

## 2017-09-07 ENCOUNTER — Encounter: Payer: Self-pay | Admitting: Adult Health

## 2017-12-06 ENCOUNTER — Other Ambulatory Visit: Payer: Self-pay | Admitting: Adult Health

## 2017-12-06 DIAGNOSIS — G308 Other Alzheimer's disease: Principal | ICD-10-CM

## 2017-12-06 DIAGNOSIS — F0281 Dementia in other diseases classified elsewhere with behavioral disturbance: Secondary | ICD-10-CM

## 2018-01-12 ENCOUNTER — Ambulatory Visit: Payer: Medicare Other | Admitting: Adult Health

## 2018-01-14 ENCOUNTER — Other Ambulatory Visit: Payer: Self-pay | Admitting: Adult Health

## 2018-01-14 ENCOUNTER — Other Ambulatory Visit: Payer: Self-pay | Admitting: Neurology

## 2018-01-17 ENCOUNTER — Encounter: Payer: Self-pay | Admitting: Adult Health

## 2018-01-17 ENCOUNTER — Ambulatory Visit: Payer: Medicare Other | Admitting: Adult Health

## 2018-01-17 VITALS — BP 137/90 | HR 49 | Ht 71.0 in | Wt 166.4 lb

## 2018-01-17 DIAGNOSIS — G308 Other Alzheimer's disease: Secondary | ICD-10-CM

## 2018-01-17 DIAGNOSIS — F0281 Dementia in other diseases classified elsewhere with behavioral disturbance: Secondary | ICD-10-CM

## 2018-01-17 DIAGNOSIS — F02818 Dementia in other diseases classified elsewhere, unspecified severity, with other behavioral disturbance: Secondary | ICD-10-CM

## 2018-01-17 MED ORDER — DONEPEZIL HCL 10 MG PO TABS: 10.0000 mg | ORAL_TABLET | Freq: Every day | ORAL | 3 refills | Status: DC

## 2018-01-17 NOTE — Progress Notes (Signed)
I have read the note, and I agree with the clinical assessment and plan.  Jaivian K Willis   

## 2018-01-17 NOTE — Patient Instructions (Addendum)
Your Plan:  Continue [name]  Decrease [name] to 10 mg at bedtime. Monitor weight- if continue to decreased aricept will have to be discontinued If your symptoms worsen or you develop new symptoms please let us know.   Thank you for coming [facility] at [facility]. I hope we have been able to provide you high quality care today.  You may receive a patient satisfaction survey over the next few weeks. We would appreciate your feedback and comments so that we may continue to improve ourselves and the health of our patients.

## 2018-01-17 NOTE — Progress Notes (Signed)
PATIENT: Darren Morgan DOB: 08/31/1940  REASON FOR VISIT: follow up HISTORY FROM: patient  HISTORY OF PRESENT ILLNESS: Today 01/17/18:  Darren Morgan is a 77 year old male with a history of Alzheimer's disease.  He returns today for follow-up.  He continues on [name] and [name].  He lives at home with his wife.  He is no longer able to complete all ADLs independently..  Wife reports that he has a poor appetite.  She has tried supplementing with Ensure.  She reports that he has lost weight since last visit.  He does have trouble sleeping.  He has been taking trazodone but they were unable to get the prescription refilled.  She reports occasionally he will have some agitation but she learned ways to address this. Denies hallucinations.  In the past the patient will get up during the night and wander but his wife has placed locks on the door.  The patient's wife reports that she is also taking care of her brother.  She states that she does need assistance.  She is unable to afford paying out-of-pocket she is wondering if he qualifies for palliative care.  He returns today for evaluation.   HISTORY 07/08/17 Darren Morgan is a 77 year old male with a history of Alzheimer's disease.  He returns today for follow-up.  He remains on [name] and [name].  He reports that he is tolerating the medication well.  His wife reports that he does require assistance with all ADLs.  He does not operate a motor vehicle.  Denies any trouble sleeping.  Denies any significant agitation or aggressiveness.  Denies hallucinations.  His wife reports that he does tend to wander at night but she put locs on the door so he cannot leave.  They deny any new neurological symptoms.  Returns today for an evaluation   REVIEW OF SYSTEMS: Out of a complete 14 system review of symptoms, the patient complains only of the following symptoms, and all other reviewed systems are negative.  See HPI  ALLERGIES: No Known Allergies  HOME  MEDICATIONS: Outpatient Medications Prior to Visit  Medication Sig Dispense Refill  . aspirin 81 MG tablet Take 81 mg by mouth daily.    Marland Kitchen CARTIA XT 240 MG 24 hr capsule Take 240 mg by mouth daily.    Marland Kitchen donepezil ([name]) 23 MG TABS tablet TAKE 1 TABLET(23 MG) BY MOUTH AT BEDTIME 90 tablet 3  . lisinopril (PRINIVIL,ZESTRIL) 40 MG tablet   0  . memantine ([name] XR) 28 MG CP24 24 hr capsule TAKE 1 CAPSULE(28 MG) BY MOUTH DAILY 90 capsule 3  . Multiple Vitamins-Minerals (CENTRUM SILVER PO) Take by mouth daily.    . traZODone (DESYREL) 50 MG tablet TAKE 1 TABLET(50 MG) BY MOUTH AT BEDTIME 90 tablet 0   No facility-administered medications prior to visit.     PAST MEDICAL HISTORY: Past Medical History:  Diagnosis Date  . 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 Atrial fibrillation (HCC) 09/27/2013  . Dementia   . Dementia 12/07/2012  . High cholesterol   . Hypertension   . Memory loss     PAST SURGICAL HISTORY: Past Surgical History:  Procedure Laterality Date  . KNEE SURGERY Right    KNEE CAP REPLACED    FAMILY HISTORY: Family History  Problem Relation Age of Onset  . Alzheimer's disease Mother   . Alzheimer's disease Maternal Aunt     SOCIAL HISTORY: Social History   Socioeconomic History  . Marital status:  Married    Spouse name: joan  . Number of children: 1  . Years of education: college  . Highest education level: Not on file  Occupational History  . Occupation: RETIRED- Economist Needs  . Financial resource strain: Not on file  . Food insecurity:    Worry: Not on file    Inability: Not on file  . Transportation needs:    Medical: Not on file    Non-medical: Not on file  Tobacco Use  . Smoking status: Former Games developer  . Smokeless tobacco: Never Used  Substance and Sexual Activity  . Alcohol use: No  . Drug use: No  . Sexual activity: Not on file  Lifestyle  . Physical activity:    Days per week: Not on file    Minutes  per session: Not on file  . Stress: Not on file  Relationships  . Social connections:    Talks on phone: Not on file    Gets together: Not on file    Attends religious service: Not on file    Active member of club or organization: Not on file    Attends meetings of clubs or organizations: Not on file    Relationship status: Not on file  . Intimate partner violence:    Fear of current or ex partner: Not on file    Emotionally abused: Not on file    Physically abused: Not on file    Forced sexual activity: Not on file  Other Topics Concern  . Not on file  Social History Narrative   [name], right handed . retired. Lives at home with his wife of 32 years, [name]. They have one grown child. Has three sodas a day. Denies alcohol, tobacco and drug use. pt consumes tea ,diet cola once a day.            PHYSICAL EXAM  Vitals:   01/17/18 1347  BP: 137/90  Pulse: (!) 49  Weight: 166 lb 6.4 oz (75.5 kg)  Height: 5\' 11"  (1.803 m)   Body mass index is 25.58 kg/m.   MMSE - Mini Mental State Exam 01/05/2017 07/06/2016 12/31/2015  Orientation to time 0 1 1  Orientation to Place 2 1 4   Registration 3 3 3   Attention/ Calculation 0 2 4  Recall 0 0 1  Language- name 2 objects 2 2 2   Language- repeat 0 1 1  Language- follow 3 step command 2 3 3   Language- read & follow direction 1 1 1   Write a sentence 1 1 1   Copy design 0 0 0  Total score 11 15 21      Generalized: Well developed, in no acute distress   Neurological examination  Mentation: Alert oriented to time, place, history taking. Follows all commands speech and language fluent Cranial nerve II-XII: Pupils were equal round reactive to light. Extraocular movements were full, visual field were full on confrontational test. Facial sensation and strength were normal. Uvula tongue midline. Head turning and shoulder shrug  were normal and symmetric. Motor: The motor testing reveals 5 over 5 strength of all 4 extremities. Good  symmetric motor tone is noted throughout.  Sensory: Sensory testing is intact to soft touch on all 4 extremities. No evidence of extinction is noted.  Coordination: Cerebellar testing reveals good finger-nose-finger and heel-to-shin bilaterally.  Gait and station: Gait is normal.  Reflexes: Deep tendon reflexes are symmetric and normal bilaterally.   DIAGNOSTIC DATA (LABS, IMAGING, TESTING) - I reviewed patient  records, labs, notes, testing and imaging myself where available.  Lab Results  Component Value Date   WBC 14.3 (H) 06/01/2014   HGB 14.6 06/01/2014   HCT 44.8 06/01/2014   MCV 98.0 06/01/2014   PLT 220 06/01/2014      Component Value Date/Time   NA 145 06/01/2014 0424   K 3.3 (L) 06/01/2014 0424   CL 105 06/01/2014 0424   CO2 27 06/01/2014 0424   GLUCOSE 105 (H) 06/01/2014 0424   BUN 18 06/01/2014 0424   CREATININE 1.25 06/01/2014 0424   CALCIUM 8.6 06/01/2014 0424   PROT 6.2 06/01/2014 0424   ALBUMIN 3.1 (L) 06/01/2014 0424   AST 25 06/01/2014 0424   ALT 10 06/01/2014 0424   ALKPHOS 68 06/01/2014 0424   BILITOT 1.1 06/01/2014 0424   GFRNONAA 56 (L) 06/01/2014 0424   GFRAA 65 (L) 06/01/2014 0424    Lab Results  Component Value Date   TSH 0.538 05/31/2014      ASSESSMENT AND PLAN 77 y.o. 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 here with:  1.  Alzheimer's disease  Patient's memory score has declined since the last visit.  He will continue on [name].  He is also had weight loss since the last visit.  I will decrease his dose of [name] 10 mg.  I have advised advised that if she continues to notice weight loss [name] will need to be discontinued.  She voiced understanding.  I also put in a referral to palliative care.  Patient's wife is advised that if symptoms worsen or she  develops new symptoms they should let us know.  He will follow-up in 6 months or sooner if needed.   I spent 15 minutes with the patient. 50% of this time was spent   Butch PennyMegan Aamir Mclinden, MSN, NP-C 01/17/2018, 1:45 PM [facility] [address], [address] [place], [place] [phone]

## 2018-01-18 ENCOUNTER — Telehealth: Payer: Self-pay

## 2018-01-18 NOTE — Telephone Encounter (Signed)
Phone call placed to patient and spoke with his family to offer to schedule visit with Palliative Care. Visit scheduled for 01/26/18.

## 2018-01-24 ENCOUNTER — Telehealth: Payer: Self-pay

## 2018-01-24 NOTE — Telephone Encounter (Signed)
Phone call placed to patient's wife to clarify time and day of scheduled visit. Scheduled for 01/25/18 @ 10 am

## 2018-01-25 ENCOUNTER — Encounter: Payer: Self-pay | Admitting: Nurse Practitioner

## 2018-01-25 ENCOUNTER — Other Ambulatory Visit: Payer: Self-pay | Admitting: Nurse Practitioner

## 2018-01-25 DIAGNOSIS — F0391 Unspecified dementia with behavioral disturbance: Secondary | ICD-10-CM

## 2018-01-25 DIAGNOSIS — R269 Unspecified abnormalities of gait and mobility: Secondary | ICD-10-CM

## 2018-01-25 DIAGNOSIS — Z515 Encounter for palliative care: Secondary | ICD-10-CM

## 2018-01-25 DIAGNOSIS — R627 Adult failure to thrive: Secondary | ICD-10-CM
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
## 2018-01-26 ENCOUNTER — Other Ambulatory Visit: Payer: Self-pay | Admitting: Nurse Practitioner

## 2018-02-11 ENCOUNTER — Other Ambulatory Visit: Payer: Self-pay | Admitting: Adult Health

## 2018-02-21 ENCOUNTER — Telehealth: Payer: Self-pay | Admitting: Adult Health

## 2018-02-21 NOTE — Telephone Encounter (Signed)
I called the patient's wife however she did not answer the phone and her voicemail was set up.  I will try again tomorrow
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
## 2018-02-22 NOTE — Telephone Encounter (Signed)
I tried to call the patient's wife.  However the phone call went straight to her voicemail.  Voicemail was not set up therefore was unable to leave a message.
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
## 2018-03-09 NOTE — Telephone Encounter (Addendum)
I tried to call the patient again.  I use the number that she provided.  It again goes to voicemail and she has no voicemail set up.  I also tried to call Zerita BoersJennifer Klann who [facility] no answer.

## 2018-03-10 NOTE — Telephone Encounter (Signed)
I tried to call the patient's wife again.  The phone does not ring- it goes straight to voicemail but there is no voicemail set up to leave a message.  If the patient happens to call back please inform her of this and send me a skype and perhaps I can answer the phone then.

## 2018-03-24 ENCOUNTER — Telehealth: Payer: Self-pay | Admitting: Adult Health

## 2018-03-24 MED ORDER — DIVALPROEX SODIUM ER 250 MG PO TB24: 250.0000 mg | ORAL_TABLET | Freq: Every day | ORAL | 5 refills | Status: AC
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
## 2018-05-04 ENCOUNTER — Other Ambulatory Visit: Payer: Self-pay | Admitting: Adult Health

## 2018-07-20 ENCOUNTER — Ambulatory Visit: Payer: Medicare Other | Admitting: Neurology

## 2018-07-20 ENCOUNTER — Telehealth: Payer: Self-pay | Admitting: Neurology

## 2018-07-20 NOTE — Telephone Encounter (Signed)
Pt was not present for apt today for follow up visit.

## 2018-07-21 ENCOUNTER — Encounter: Payer: Self-pay | Admitting: Neurology

## 2018-07-27 ENCOUNTER — Other Ambulatory Visit: Payer: Self-pay | Admitting: Neurology

## 2018-08-13 ENCOUNTER — Encounter (HOSPITAL_COMMUNITY): Payer: Self-pay

## 2018-08-13 ENCOUNTER — Inpatient Hospital Stay (HOSPITAL_COMMUNITY)
Admission: EM | Admit: 2018-08-13 | Discharge: 2018-08-16 | DRG: 066 | Disposition: A | Discharge status: Hospice | Payer: Medicare Other | Attending: Internal Medicine | Admitting: Internal Medicine

## 2018-08-13 ENCOUNTER — Emergency Department (HOSPITAL_COMMUNITY): Payer: Medicare Other

## 2018-08-13 ENCOUNTER — Other Ambulatory Visit: Payer: Self-pay

## 2018-08-13 DIAGNOSIS — Z7982 Long term (current) use of aspirin: Secondary | ICD-10-CM

## 2018-08-13 DIAGNOSIS — I480 Paroxysmal atrial fibrillation: Secondary | ICD-10-CM | POA: Diagnosis not present

## 2018-08-13 DIAGNOSIS — I4891 Unspecified atrial fibrillation: Secondary | ICD-10-CM | POA: Diagnosis present

## 2018-08-13 DIAGNOSIS — Z87891 Personal history of nicotine dependence: Secondary | ICD-10-CM | POA: Diagnosis not present

## 2018-08-13 DIAGNOSIS — F0281 Dementia in other diseases classified elsewhere with behavioral disturbance: Secondary | ICD-10-CM | POA: Diagnosis not present

## 2018-08-13 DIAGNOSIS — I10 Essential (primary) hypertension: Secondary | ICD-10-CM | POA: Diagnosis present

## 2018-08-13 DIAGNOSIS — Z7189 Other specified counseling: Secondary | ICD-10-CM | POA: Diagnosis not present

## 2018-08-13 DIAGNOSIS — Z515 Encounter for palliative care: Secondary | ICD-10-CM | POA: Diagnosis present

## 2018-08-13 DIAGNOSIS — Z66 Do not resuscitate: Secondary | ICD-10-CM | POA: Diagnosis present

## 2018-08-13 DIAGNOSIS — E785 Hyperlipidemia, unspecified: Secondary | ICD-10-CM | POA: Diagnosis present

## 2018-08-13 DIAGNOSIS — G309 Alzheimer's disease, unspecified: Secondary | ICD-10-CM | POA: Diagnosis present

## 2018-08-13 DIAGNOSIS — R270 Ataxia, unspecified: Secondary | ICD-10-CM | POA: Diagnosis present

## 2018-08-13 DIAGNOSIS — G308 Other Alzheimer's disease: Secondary | ICD-10-CM | POA: Diagnosis not present

## 2018-08-13 DIAGNOSIS — F0280 Dementia in other diseases classified elsewhere without behavioral disturbance: Secondary | ICD-10-CM | POA: Diagnosis present

## 2018-08-13 DIAGNOSIS — Z820 Family history of epilepsy and other diseases of the nervous system: Secondary | ICD-10-CM

## 2018-08-13 DIAGNOSIS — R29702 NIHSS score 2: Secondary | ICD-10-CM | POA: Diagnosis present

## 2018-08-13 DIAGNOSIS — E7800 Pure hypercholesterolemia, unspecified: Secondary | ICD-10-CM | POA: Diagnosis present

## 2018-08-13 DIAGNOSIS — I639 Cerebral infarction, unspecified: Secondary | ICD-10-CM | POA: Diagnosis present

## 2018-08-13 LAB — URINALYSIS, ROUTINE W REFLEX MICROSCOPIC
Bilirubin Urine: NEGATIVE
Glucose, UA: NEGATIVE mg/dL
Hgb urine dipstick: NEGATIVE
Ketones, ur: NEGATIVE mg/dL
Leukocytes,Ua: NEGATIVE
Nitrite: NEGATIVE
Protein, ur: NEGATIVE mg/dL
Specific Gravity, Urine: 1.008 (ref 1.005–1.030)
pH: 9 — ABNORMAL HIGH (ref 5.0–8.0)

## 2018-08-13 LAB — COMPREHENSIVE METABOLIC PANEL
ALT: 19 U/L (ref 0–44)
AST: 27 U/L (ref 15–41)
Albumin: 3.7 g/dL (ref 3.5–5.0)
Alkaline Phosphatase: 102 U/L (ref 38–126)
Anion gap: 12 (ref 5–15)
BUN: 10 mg/dL (ref 8–23)
CALCIUM: 9.3 mg/dL (ref 8.9–10.3)
CO2: 26 mmol/L (ref 22–32)
Chloride: 104 mmol/L (ref 98–111)
Creatinine, Ser: 1.05 mg/dL (ref 0.61–1.24)
GFR calc non Af Amer: >60 mL/min (ref >60)
Glucose, Bld: 100 mg/dL — ABNORMAL HIGH (ref 70–99)
Potassium: 4.1 mmol/L (ref 3.5–5.1)
Sodium: 142 mmol/L (ref 135–145)
Total Bilirubin: 0.7 mg/dL (ref 0.3–1.2)
Total Protein: 7 g/dL (ref 6.5–8.1)

## 2018-08-13 LAB — CBC WITH DIFFERENTIAL/PLATELET
Abs Immature Granulocytes: 0.05 10*3/uL (ref 0.00–0.07)
BASOS ABS: 0.1 10*3/uL (ref 0.0–0.1)
BASOS PCT: 1 %
EOS ABS: 0.3 10*3/uL (ref 0.0–0.5)
EOS PCT: 2 %
HEMATOCRIT: 47.8 % (ref 39.0–52.0)
Hemoglobin: 15.4 g/dL (ref 13.0–17.0)
Immature Granulocytes: 0 %
LYMPHS ABS: 3 10*3/uL (ref 0.7–4.0)
Lymphocytes Relative: 25 %
MCH: 32.2 pg (ref 26.0–34.0)
MCHC: 32.2 g/dL (ref 30.0–36.0)
MCV: 100 fL (ref 80.0–100.0)
MONOS PCT: 6 %
Monocytes Absolute: 0.8 10*3/uL (ref 0.1–1.0)
NEUTROS PCT: 66 %
NRBC: 0 % (ref 0.0–0.2)
Neutro Abs: 7.6 10*3/uL (ref 1.7–7.7)
PLATELETS: 224 10*3/uL (ref 150–400)
RBC: 4.78 MIL/uL (ref 4.22–5.81)
RDW: 13.2 % (ref 11.5–15.5)
WBC: 11.7 10*3/uL — ABNORMAL HIGH (ref 4.0–10.5)

## 2018-08-13 MED ORDER — GLYCOPYRROLATE 0.2 MG/ML IJ SOLN: 0.2000 mg | INTRAMUSCULAR | Status: DC | PRN

## 2018-08-13 MED ORDER — POLYVINYL ALCOHOL 1.4 % OP SOLN
1.0000 [drp] | Freq: Four times a day (QID) | OPHTHALMIC | Status: DC | PRN
  Filled 2018-08-13: qty 15

## 2018-08-13 MED ORDER — LORAZEPAM 2 MG/ML IJ SOLN: 1.0000 mg | INTRAMUSCULAR | Status: DC | PRN

## 2018-08-13 MED ORDER — OXYCODONE HCL 20 MG/ML PO CONC: 5.0000 mg | ORAL | Status: DC | PRN

## 2018-08-13 MED ORDER — ACETAMINOPHEN 650 MG RE SUPP: 650.0000 mg | Freq: Four times a day (QID) | RECTAL | Status: DC | PRN

## 2018-08-13 MED ORDER — HALOPERIDOL LACTATE 2 MG/ML PO CONC
0.5000 mg | ORAL | Status: DC | PRN
  Filled 2018-08-13: qty 0.3

## 2018-08-13 MED ORDER — BIOTENE DRY MOUTH MT LIQD: 15.0000 mL | OROMUCOSAL | Status: DC | PRN

## 2018-08-13 MED ORDER — HALOPERIDOL LACTATE 5 MG/ML IJ SOLN
0.5000 mg | INTRAMUSCULAR | Status: DC | PRN
  Administered 2018-08-13 – 2018-08-15 (×6): 0.5 mg via INTRAVENOUS
  Filled 2018-08-13 (×7): qty 1

## 2018-08-13 MED ORDER — ACETAMINOPHEN 325 MG PO TABS: 650.0000 mg | ORAL_TABLET | Freq: Four times a day (QID) | ORAL | Status: DC | PRN

## 2018-08-13 MED ORDER — HALOPERIDOL 0.5 MG PO TABS
0.5000 mg | ORAL_TABLET | ORAL | Status: DC | PRN
  Administered 2018-08-16: 0.5 mg via ORAL
  Filled 2018-08-13 (×2): qty 1

## 2018-08-13 MED ORDER — BISACODYL 10 MG RE SUPP: 10.0000 mg | Freq: Every day | RECTAL | Status: DC | PRN

## 2018-08-13 MED ORDER — ONDANSETRON 4 MG PO TBDP: 4.0000 mg | ORAL_TABLET | Freq: Four times a day (QID) | ORAL | Status: DC | PRN

## 2018-08-13 MED ORDER — MORPHINE SULFATE (PF) 2 MG/ML IV SOLN: 1.0000 mg | INTRAVENOUS | Status: DC | PRN

## 2018-08-13 MED ORDER — ONDANSETRON HCL 4 MG/2ML IJ SOLN: 4.0000 mg | Freq: Four times a day (QID) | INTRAMUSCULAR | Status: DC | PRN

## 2018-08-13 MED ORDER — GLYCOPYRROLATE 1 MG PO TABS
1.0000 mg | ORAL_TABLET | ORAL | Status: DC | PRN
  Filled 2018-08-13: qty 1

## 2018-08-13 MED ORDER — DIPHENHYDRAMINE HCL 50 MG/ML IJ SOLN
12.5000 mg | INTRAMUSCULAR | Status: DC | PRN
  Administered 2018-08-15: 12.5 mg via INTRAVENOUS
  Filled 2018-08-13: qty 1

## 2018-08-13 NOTE — ED Notes (Signed)
Pt being transported to 3W13 via stretcher and pt's spouse via escorted via w/c. Spouse placed all of pt's belongings on stretcher.

## 2018-08-13 NOTE — ED Triage Notes (Signed)
Pt arrives to ED from home with complaints of fall since x2 days ago while walking with his walker, pt fell back onto his butt. EMS reports pt has hx of alzheimer's and wife is worried it is getting worse, pt smalls strongly of urine upon arrival, brief soaked with urine. Pt placed in position of comfort with bed locked and lowered, call bell in reach.

## 2018-08-13 NOTE — ED Notes (Signed)
Pt lying on bed attempting to place his legs through bed rail - pillows and blankets applied. Pt noted to be biting his nails - spouse at bedside advised this is normal for pt. Spouse gave pt candy as she does at home.

## 2018-08-13 NOTE — ED Notes (Signed)
Pt arrived to Rm 48 via stretcher. Pt noted to be alert to himself. Spouse at bedside. Call bell w/in reach.

## 2018-08-13 NOTE — ED Notes (Signed)
ED TO INPATIENT HANDOFF REPORT  ED Nurse Name and Phone #: [phone]  S Name/Age/Gender Darren Morgan 78 y.o. male Room/Bed: 020C/020C  Code Status   Code Status: DNR  Home/SNF/Other palliative care Patient oriented to: self Is this baseline? Yes   Triage Complete: Triage complete  Chief Complaint Fall  Triage Note Pt arrives to ED from home with complaints of fall since x2 days ago while walking with his walker, pt fell back onto his butt. EMS reports pt has hx of alzheimer's and wife is worried it is getting worse, pt smalls strongly of urine upon arrival, brief soaked with urine. Pt placed in position of comfort with bed locked and lowered, call bell in reach.    Allergies No Known Allergies  Level of Care/Admitting Diagnosis ED Disposition    ED Disposition Condition Comment   Admit  Hospital Area: [facility] [[id number]]  Level of Care: Palliative Care [15]  Diagnosis: CVA (cerebral vascular accident) [facility]) [[id number]]  Admitting Physician: Jonah Blue [[id number]]  Attending Physician: Jonah Blue [[id number]]  Estimated length of stay: past midnight tomorrow  Certification:: I certify this patient will need inpatient services for at least 2 midnights  PT Class (Do Not Modify): Inpatient [101]  PT Acc Code (Do Not Modify): Private [1]       B Medical/Surgery History Past Medical History:  Diagnosis Date  . 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 Atrial fibrillation (HCC) 09/27/2013  . Dementia (HCC)   . Dementia (HCC) 12/07/2012  . High cholesterol   . Hypertension   . Memory loss    Past Surgical History:  Procedure Laterality Date  . KNEE SURGERY Right    KNEE CAP REPLACED     A IV Location/Drains/Wounds Patient Lines/Drains/Airways Status   Active Line/Drains/Airways    Name:   Placement date:   Placement time:   Site:   Days:   Peripheral IV 08/13/18 Right Antecubital   08/13/18    0957    Antecubital    less than 1          Intake/Output Last 24 hours No intake or output data in the 24 hours ending 08/13/18 1606  Labs/Imaging Results for orders placed or performed during the hospital encounter of 08/13/18 (from the past 48 hour(s))  CBC with Differential/Platelet     Status: Abnormal   Collection Time: 08/13/18  9:56 AM  Result Value Ref Range   WBC 11.7 (H) 4.0 - 10.5 K/uL   RBC 4.78 4.22 - 5.81 MIL/uL   Hemoglobin 15.4 13.0 - 17.0 g/dL   HCT 46.9 62.9 - 52.8 %   MCV 100.0 80.0 - 100.0 fL   MCH 32.2 26.0 - 34.0 pg   MCHC 32.2 30.0 - 36.0 g/dL   RDW 41.3 24.4 - 01.0 %   Platelets 224 150 - 400 K/uL   nRBC 0.0 0.0 - 0.2 %   Neutrophils Relative % 66 %   Neutro Abs 7.6 1.7 - 7.7 K/uL   Lymphocytes Relative 25 %   Lymphs Abs 3.0 0.7 - 4.0 K/uL   Monocytes Relative 6 %   Monocytes Absolute 0.8 0.1 - 1.0 K/uL   Eosinophils Relative 2 %   Eosinophils Absolute 0.3 0.0 - 0.5 K/uL   Basophils Relative 1 %   Basophils Absolute 0.1 0.0 - 0.1 K/uL   Immature Granulocytes 0 %   Abs Immature Granulocytes 0.05 0.00 - 0.07 K/uL    Comment: Performed at [facility], [address] [place], [place] [address]  Comprehensive metabolic panel     Status: Abnormal   Collection Time: 08/13/18  9:56 AM  Result Value Ref Range   Sodium 142 135 - 145 mmol/L   Potassium 4.1 3.5 - 5.1 mmol/L   Chloride 104 98 - 111 mmol/L   CO2 26 22 - 32 mmol/L   Glucose, Bld 100 (H) 70 - 99 mg/dL   BUN 10 8 - 23 mg/dL   Creatinine, Ser 7.03 0.61 - 1.24 mg/dL   Calcium 9.3 8.9 - 50.0 mg/dL   Total Protein 7.0 6.5 - 8.1 g/dL   Albumin 3.7 3.5 - 5.0 g/dL   AST 27 15 - 41 U/L   ALT 19 0 - 44 U/L   Alkaline Phosphatase 102 38 - 126 U/L   Total Bilirubin 0.7 0.3 - 1.2 mg/dL   GFR calc non Af Amer >60 >60 mL/min   GFR calc Af Amer >60 >60 mL/min   Anion gap 12 5 - 15    Comment: Performed at [facility], [address] [place], [place] [address]  Urinalysis, Routine w reflex microscopic      Status: Abnormal   Collection Time: 08/13/18 12:50 PM  Result Value Ref Range   Color, Urine STRAW (A) YELLOW   APPearance CLEAR CLEAR   Specific Gravity, Urine 1.008 1.005 - 1.030   pH 9.0 (H) 5.0 - 8.0   Glucose, UA NEGATIVE NEGATIVE mg/dL   Hgb urine dipstick NEGATIVE NEGATIVE   Bilirubin Urine NEGATIVE NEGATIVE   Ketones, ur NEGATIVE NEGATIVE mg/dL   Protein, ur NEGATIVE NEGATIVE mg/dL   Nitrite NEGATIVE NEGATIVE   Leukocytes,Ua NEGATIVE NEGATIVE    Comment: Performed at [facility], [address] [place], [place] [address]   Dg Chest 2 View  Result Date: 08/13/2018 CLINICAL DATA:  Fall 2 days ago. Hypertension. Dementia. Former smoker. EXAM: CHEST - 2 VIEW COMPARISON:  05/31/2014 FINDINGS: Cardiac silhouette is top-normal in size. No mediastinal or hilar masses. No evidence of adenopathy. Prominent bronchovascular markings in the bases. Lungs otherwise clear. No pleural effusion or pneumothorax. Skeletal structures are intact. IMPRESSION: No active cardiopulmonary disease. Electronically Signed   By: Amie Portland M.D.   On: 08/13/2018 10:30   Dg Pelvis 1-2 Views  Result Date: 08/13/2018 CLINICAL DATA:  Fall 2 days ago.  Pain. EXAM: PELVIS - 1-2 VIEW COMPARISON:  06/01/2014 FINDINGS: No fracture or bone lesion. Hip joints, SI joints and symphysis pubis are normally aligned. Soft tissues are unremarkable. IMPRESSION: No fracture or dislocation Electronically Signed   By: Amie Portland M.D.   On: 08/13/2018 10:31   Ct Head Wo Contrast  Result Date: 08/13/2018 CLINICAL DATA:  Altered mental status. EXAM: CT HEAD WITHOUT CONTRAST TECHNIQUE: Contiguous axial images were obtained from the base of the skull through the vertex without intravenous contrast. COMPARISON:  Brain [name], 07/13/2013 FINDINGS: Brain: There is focal hypoattenuation in the left medial occipital lobe, extending to the posterior inferior left parietal lobe, consistent with a recent left PCA distribution infarct.  This involves the white matter and overlying cortical ribbon. There is no other evidence of a recent infarct old lacunar infarct is noted in the right thalamus. There is ventricular and sulcal enlargement reflecting moderate atrophy, increased when compared to the prior brain [name]. There are no parenchymal masses. There is no midline shift. There are no extra-axial masses or abnormal fluid collections. There is no intracranial hemorrhage. Vascular:  No hyperdense vessel or unexpected calcification. Skull: Normal. Negative for fracture or focal lesion. Sinuses/Orbits: Visualize globes and orbits are unremarkable. The visualized sinuses and mastoid air cells are clear. Other: None. IMPRESSION: 1. Recent, likely early subacute, left PCA distribution infarct. 2. No other acute or recent abnormalities. No intracranial hemorrhage. 3. Moderate atrophy.  Old right thalamic lacunar infarct. Electronically Signed   By: Amie Portland M.D.   On: 08/13/2018 11:10    Pending Labs Unresulted Labs (From admission, onward)   None      Vitals/Pain Today's Vitals   08/13/18 0951 08/13/18 0952 08/13/18 1222 08/13/18 1605  BP: (!) 131/92 (!) 131/92 (!) 167/85 (!) 165/83  Pulse: 69 81 (!) 59 61  Resp: 16  20 18   Temp: (!) 97.3 F (36.3 C)     TempSrc: Oral     SpO2: 100% 97% 95% 97%  Weight:      Height:      PainSc:        Isolation Precautions No active isolations  Medications Medications  acetaminophen (TYLENOL) tablet 650 mg (has no administration in time range)    Or  acetaminophen (TYLENOL) suppository 650 mg (has no administration in time range)  haloperidol (HALDOL) tablet 0.5 mg ( Oral See Alternative 08/13/18 1519)    Or  haloperidol (HALDOL) 2 MG/ML solution 0.5 mg ( Sublingual See Alternative 08/13/18 1519)    Or  haloperidol lactate (HALDOL) injection 0.5 mg (0.5 mg Intravenous Given 08/13/18 1519)  ondansetron (ZOFRAN-ODT) disintegrating tablet 4 mg (has no administration in time range)     Or  ondansetron (ZOFRAN) injection 4 mg (has no administration in time range)  glycopyrrolate (ROBINUL) tablet 1 mg (has no administration in time range)    Or  glycopyrrolate (ROBINUL) injection 0.2 mg (has no administration in time range)    Or  glycopyrrolate (ROBINUL) injection 0.2 mg (has no administration in time range)  antiseptic oral rinse (BIOTENE) solution 15 mL (has no administration in time range)  polyvinyl alcohol (LIQUIFILM TEARS) 1.4 % ophthalmic solution 1 drop (has no administration in time range)  morphine 2 MG/ML injection 1 mg (has no administration in time range)  oxyCODONE (ROXICODONE INTENSOL) 20 MG/ML concentrated solution 5 mg (has no administration in time range)    Or  oxyCODONE (ROXICODONE INTENSOL) 20 MG/ML concentrated solution 5 mg (has no administration in time range)  diphenhydrAMINE (BENADRYL) injection 12.5 mg (has no administration in time range)  bisacodyl (DULCOLAX) suppository 10 mg (has no administration in time range)  LORazepam (ATIVAN) injection 1 mg (has no administration in time range)    Mobility walks with device High fall risk   Focused Assessments Neuro Assessment Handoff:  Swallow screen pass? Yes    NIH Stroke Scale ( + Modified Stroke Scale Criteria)  Interval: Initial Level of Consciousness (1a.)   : Alert, keenly responsive LOC Questions (1b. )   +: Answers neither question correctly LOC Commands (1c. )   + : Performs both tasks correctly Best Gaze (2. )  +: Normal Visual (3. )  +: No visual loss Facial Palsy (4. )    : Normal symmetrical movements Motor Arm, Left (5a. )   +: No drift Motor Arm, Right (5b. )   +: No drift Motor Leg, Left (6a. )   +: No drift Motor Leg, Right (6b. )   +: No drift Limb Ataxia (7. ): Absent Sensory (8. )   +: Normal, no sensory loss Best Language (9. )   +:  No aphasia Dysarthria (10. ): Normal Extinction/Inattention (11.)   +: No Abnormality Modified SS Total  +: 2 Complete NIHSS TOTAL:  2     Neuro Assessment: Exceptions to WDL Neuro Checks:   Initial (08/13/18 1118)  Last Documented NIHSS Modified Score: 2 (08/13/18 1118) Has TPA been given? No If patient is a Neuro Trauma and patient is going to OR before floor call report to 4N Charge nurse: [phone] or [phone]     R Recommendations: See Admitting Provider Note  Report given to:   Additional Notes: pt has been living at home with wife

## 2018-08-13 NOTE — ED Notes (Signed)
ED TO INPATIENT HANDOFF REPORT  ED Nurse Name and Phone #: Kriste Basque, RN [phone]  S Name/Age/Gender Darren Morgan 78 y.o. male Room/Bed: [phone]  Code Status   Code Status: DNR  Home/SNF/Other Hospice Patient oriented to: self Is this baseline? Yes   Triage Complete: Triage complete  Chief Complaint Fall  Triage Note Pt arrives to ED from home with complaints of fall since x2 days ago while walking with his walker, pt fell back onto his butt. EMS reports pt has hx of alzheimer's and wife is worried it is getting worse, pt smalls strongly of urine upon arrival, brief soaked with urine. Pt placed in position of comfort with bed locked and lowered, call bell in reach.    Allergies No Known Allergies  Level of Care/Admitting Diagnosis ED Disposition    ED Disposition Condition Comment   Admit  [facility]ospital Area: [facility] [[id number]]  Level of Care: Palliative Care [15]  Diagnosis: CVA (cerebral vascular accident) [facility]) [[id number]]  Admitting Physician: Jonah Blue [[id number]]  Attending Physician: Jonah Blue [[id number]]  Estimated length of stay: past midnight tomorrow  Certification:: I certify this patient will need inpatient services for at least 2 midnights  PT Class (Do Not Modify): Inpatient [101]  PT Acc Code (Do Not Modify): Private [1]       B Medical/Surgery History Past Medical History:  Diagnosis Date  . 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 Atrial fibrillation (HCC) 09/27/2013  . Dementia (HCC)   . Dementia (HCC) 12/07/2012  . High cholesterol   . Hypertension   . Memory loss    Past Surgical History:  Procedure Laterality Date  . KNEE SURGERY Right    KNEE CAP REPLACED     A IV Location/Drains/Wounds Patient Lines/Drains/Airways Status   Active Line/Drains/Airways    Name:   Placement date:   Placement time:   Site:   Days:   Peripheral IV 08/13/18 Right Antecubital   08/13/18    0957    Antecubital    less than 1          Intake/Output Last 24 hours No intake or output data in the 24 hours ending 08/13/18 1740  Labs/Imaging Results for orders placed or performed during the hospital encounter of 08/13/18 (from the past 48 hour(s))  CBC with Differential/Platelet     Status: Abnormal   Collection Time: 08/13/18  9:56 AM  Result Value Ref Range   WBC 11.7 (H) 4.0 - 10.5 K/uL   RBC 4.78 4.22 - 5.81 MIL/uL   Hemoglobin 15.4 13.0 - 17.0 g/dL   HCT 32.9 51.8 - 84.1 %   MCV 100.0 80.0 - 100.0 fL   MCH 32.2 26.0 - 34.0 pg   MCHC 32.2 30.0 - 36.0 g/dL   RDW 66.0 63.0 - 16.0 %   Platelets 224 150 - 400 K/uL   nRBC 0.0 0.0 - 0.2 %   Neutrophils Relative % 66 %   Neutro Abs 7.6 1.7 - 7.7 K/uL   Lymphocytes Relative 25 %   Lymphs Abs 3.0 0.7 - 4.0 K/uL   Monocytes Relative 6 %   Monocytes Absolute 0.8 0.1 - 1.0 K/uL   Eosinophils Relative 2 %   Eosinophils Absolute 0.3 0.0 - 0.5 K/uL   Basophils Relative 1 %   Basophils Absolute 0.1 0.0 - 0.1 K/uL   Immature Granulocytes 0 %   Abs Immature Granulocytes 0.05 0.00 - 0.07 K/uL    Comment: Performed at  [facility], [address], [place], [place] [address]  Comprehensive metabolic panel     Status: Abnormal   Collection Time: 08/13/18  9:56 AM  Result Value Ref Range   Sodium 142 135 - 145 mmol/L   Potassium 4.1 3.5 - 5.1 mmol/L   Chloride 104 98 - 111 mmol/L   CO2 26 22 - 32 mmol/L   Glucose, Bld 100 (H) 70 - 99 mg/dL   BUN 10 8 - 23 mg/dL   Creatinine, Ser 6.04 0.61 - 1.24 mg/dL   Calcium 9.3 8.9 - 54.0 mg/dL   Total Protein 7.0 6.5 - 8.1 g/dL   Albumin 3.7 3.5 - 5.0 g/dL   AST 27 15 - 41 U/L   ALT 19 0 - 44 U/L   Alkaline Phosphatase 102 38 - 126 U/L   Total Bilirubin 0.7 0.3 - 1.2 mg/dL   GFR calc non Af Amer >60 >60 mL/min   GFR calc Af Amer >60 >60 mL/min   Anion gap 12 5 - 15    Comment: Performed at [facility], [address], [place], [place] [address]  Urinalysis, Routine w reflex microscopic      Status: Abnormal   Collection Time: 08/13/18 12:50 PM  Result Value Ref Range   Color, Urine STRAW (A) YELLOW   APPearance CLEAR CLEAR   Specific Gravity, Urine 1.008 1.005 - 1.030   pH 9.0 (H) 5.0 - 8.0   Glucose, UA NEGATIVE NEGATIVE mg/dL   Hgb urine dipstick NEGATIVE NEGATIVE   Bilirubin Urine NEGATIVE NEGATIVE   Ketones, ur NEGATIVE NEGATIVE mg/dL   Protein, ur NEGATIVE NEGATIVE mg/dL   Nitrite NEGATIVE NEGATIVE   Leukocytes,Ua NEGATIVE NEGATIVE    Comment: Performed at [facility], [address] [place], [place] [address]   Dg Chest 2 View  Result Date: 08/13/2018 CLINICAL DATA:  Fall 2 days ago. Hypertension. Dementia. Former smoker. EXAM: CHEST - 2 VIEW COMPARISON:  05/31/2014 FINDINGS: Cardiac silhouette is top-normal in size. No mediastinal or hilar masses. No evidence of adenopathy. Prominent bronchovascular markings in the bases. Lungs otherwise clear. No pleural effusion or pneumothorax. Skeletal structures are intact. IMPRESSION: No active cardiopulmonary disease. Electronically Signed   By: Amie Portland M.D.   On: 08/13/2018 10:30   Dg Pelvis 1-2 Views  Result Date: 08/13/2018 CLINICAL DATA:  Fall 2 days ago.  Pain. EXAM: PELVIS - 1-2 VIEW COMPARISON:  06/01/2014 FINDINGS: No fracture or bone lesion. Hip joints, SI joints and symphysis pubis are normally aligned. Soft tissues are unremarkable. IMPRESSION: No fracture or dislocation Electronically Signed   By: Amie Portland M.D.   On: 08/13/2018 10:31   Ct Head Wo Contrast  Result Date: 08/13/2018 CLINICAL DATA:  Altered mental status. EXAM: CT HEAD WITHOUT CONTRAST TECHNIQUE: Contiguous axial images were obtained from the base of the skull through the vertex without intravenous contrast. COMPARISON:  Brain [name], 07/13/2013 FINDINGS: Brain: There is focal hypoattenuation in the left medial occipital lobe, extending to the posterior inferior left parietal lobe, consistent with a recent left PCA distribution infarct.  This involves the white matter and overlying cortical ribbon. There is no other evidence of a recent infarct old lacunar infarct is noted in the right thalamus. There is ventricular and sulcal enlargement reflecting moderate atrophy, increased when compared to the prior brain [name]. There are no parenchymal masses. There is no midline shift. There are no extra-axial masses or abnormal fluid collections. There is no intracranial hemorrhage.  Vascular: No hyperdense vessel or unexpected calcification. Skull: Normal. Negative for fracture or focal lesion. Sinuses/Orbits: Visualize globes and orbits are unremarkable. The visualized sinuses and mastoid air cells are clear. Other: None. IMPRESSION: 1. Recent, likely early subacute, left PCA distribution infarct. 2. No other acute or recent abnormalities. No intracranial hemorrhage. 3. Moderate atrophy.  Old right thalamic lacunar infarct. Electronically Signed   By: Amie Portland M.D.   On: 08/13/2018 11:10    Pending Labs Unresulted Labs (From admission, onward)   None      Vitals/Pain Today's Vitals   08/13/18 0951 08/13/18 0952 08/13/18 1222 08/13/18 1605  BP: (!) 131/92 (!) 131/92 (!) 167/85 (!) 165/83  Pulse: 69 81 (!) 59 61  Resp: 16  20 18   Temp: (!) 97.3 F (36.3 C)     TempSrc: Oral     SpO2: 100% 97% 95% 97%  Weight:      Height:      PainSc:        Isolation Precautions No active isolations  Medications Medications  acetaminophen (TYLENOL) tablet 650 mg (has no administration in time range)    Or  acetaminophen (TYLENOL) suppository 650 mg (has no administration in time range)  haloperidol (HALDOL) tablet 0.5 mg ( Oral See Alternative 08/13/18 1519)    Or  haloperidol (HALDOL) 2 MG/ML solution 0.5 mg ( Sublingual See Alternative 08/13/18 1519)    Or  haloperidol lactate (HALDOL) injection 0.5 mg (0.5 mg Intravenous Given 08/13/18 1519)  ondansetron (ZOFRAN-ODT) disintegrating tablet 4 mg (has no administration in time range)     Or  ondansetron (ZOFRAN) injection 4 mg (has no administration in time range)  glycopyrrolate (ROBINUL) tablet 1 mg (has no administration in time range)    Or  glycopyrrolate (ROBINUL) injection 0.2 mg (has no administration in time range)    Or  glycopyrrolate (ROBINUL) injection 0.2 mg (has no administration in time range)  antiseptic oral rinse (BIOTENE) solution 15 mL (has no administration in time range)  polyvinyl alcohol (LIQUIFILM TEARS) 1.4 % ophthalmic solution 1 drop (has no administration in time range)  morphine 2 MG/ML injection 1 mg (has no administration in time range)  oxyCODONE (ROXICODONE INTENSOL) 20 MG/ML concentrated solution 5 mg (has no administration in time range)    Or  oxyCODONE (ROXICODONE INTENSOL) 20 MG/ML concentrated solution 5 mg (has no administration in time range)  diphenhydrAMINE (BENADRYL) injection 12.5 mg (has no administration in time range)  bisacodyl (DULCOLAX) suppository 10 mg (has no administration in time range)  LORazepam (ATIVAN) injection 1 mg (has no administration in time range)    Mobility non-ambulatory High fall risk   Focused Assessments 2   R Recommendations: See Admitting Provider Note  Report given to:   Additional Notes:

## 2018-08-13 NOTE — ED Notes (Signed)
DNR band placed on right wrist 

## 2018-08-13 NOTE — H&P (Signed)
History and Physical    Darren Morgan QQP:[id number] DOB: 1940-07-10 DOA: 08/13/2018  PCP: [name], MD Consultants:  Dohmeier - neurology Patient coming from:  Home - lives with wife; NOK: Wife, [phone]  Chief Complaint: Weakness  HPI: Darren Morgan is a 78 y.o. male with medical history significant of dementia; HTN; HLD; and afib presenting with weakness.  Thursday AM, his whole demeanor changed.  Usually at breakfast, his wife cuts his pancakes and he feeds himself.  That day, he couldn't feed himself and couldn't hold his Ensure in his right hand.  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  That evening, he lost his balance and fell.  EMS was called to get him back to bed.  The next morning, she had to use a wheelchair instead of walker.  He usually allows her to help him, but he was less helpful.  This AM, he couldn't even get up.  He usually pulls himself up with both hands but couldn't use his right hand.  As of Thursday, his wife was able to help him but he was able to support her.  He is on a new behavioral medication but seemed to be okay.  He couldn't do things independently. But not since Thursday.  She met with Palliative Care remotely in the past and he did not qualify for help at that time.  She would like to have hospice with help at home.  Upon further discussion, she is exhausted and would prefer placement at [facility] if at all possible.   ED Course:  CVA Thursday.  Has underlying dementia - until Thursday, able to stand and walk with assistance and feed himself.  He fell Thursday, since then unable to feed himself and unable to get out of bed.  Has subacute CVA on CT.  DNR.  Previous conversation with Palliative Care, interested in Hospice.  Review of Systems: Unable to perform  Ambulatory Status:  See above  Past Medical History:  Diagnosis Date  . 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 Atrial fibrillation ([place]) 09/27/2013  . Dementia ([place])     . Dementia ([place]) 12/07/2012  . High cholesterol   . Hypertension   . Memory loss     Past Surgical History:  Procedure Laterality Date  . KNEE SURGERY Right    KNEE CAP REPLACED    Social History   Socioeconomic History  . Marital status: Married    Spouse name: joan  . Number of children: 1  . Years of education: college  . Highest education level: Not on file  Occupational History  . Occupation: RETIRED- Haematologist Needs  . Financial resource strain: Not on file  . Food insecurity:    Worry: Not on file    Inability: Not on file  . Transportation needs:    Medical: Not on file    Non-medical: Not on file  Tobacco Use  . Smoking status: Former Research scientist (life sciences)  . Smokeless tobacco: Never Used  Substance and Sexual Activity  . Alcohol use: No  . Drug use: No  . Sexual activity: Not on file  Lifestyle  . Physical activity:    Days per week: Not on file    Minutes per session: Not on file  . Stress: Not on file  Relationships  . Social connections:    Talks on phone: Not on file    Gets together: Not on file    Attends religious service: Not on file  Active member of club or organization: Not on file    Attends meetings of clubs or organizations: Not on file    Relationship status: Not on file  . Intimate partner violence:    Fear of current or ex partner: Not on file    Emotionally abused: Not on file    Physically abused: Not on file    Forced sexual activity: Not on file  Other Topics Concern  . Not on file  Social History Narrative   [name], right handed . retired. Lives at home with his wife of 53 years, Darren Morgan. They have one grown child. Has three sodas a day. Denies alcohol, tobacco and drug use. pt consumes tea ,diet cola once a day.          No Known Allergies  Family History  Problem Relation Age of Onset  . Alzheimer's disease Mother   . Alzheimer's disease Maternal Aunt     Prior to Admission medications   Medication Sig Start  Date End Date Taking? Authorizing Provider  aspirin 81 MG tablet Take 81 mg by mouth daily.   Yes [provider]  CARTIA XT 240 MG 24 hr capsule Take 240 mg by mouth daily. 05/05/16  Yes [provider]  divalproex (DEPAKOTE ER) 250 MG 24 hr tablet Take 1 tablet (250 mg total) by mouth daily. 03/24/18  Yes Ward Givens, NP  donepezil ([name]) 10 MG tablet Take 1 tablet (10 mg total) by mouth at bedtime. 01/17/18  Yes Ward Givens, NP  lisinopril (PRINIVIL,ZESTRIL) 40 MG tablet Take 40 mg by mouth daily.  12/26/15  Yes [provider]  memantine ([name] XR) 28 MG CP24 24 hr capsule TAKE 1 CAPSULE(28 MG) BY MOUTH DAILY Patient taking differently: Take 28 mg by mouth daily.  12/06/17  Yes Kathrynn Ducking, MD  Multiple Vitamins-Minerals (CENTRUM SILVER PO) Take 1 tablet by mouth daily.    Yes [provider]  traZODone (DESYREL) 50 MG tablet TAKE 1 TABLET BY MOUTH AT BEDTIME Patient taking differently: Take 50 mg by mouth at bedtime.  07/27/18  Yes Kathrynn Ducking, MD    Physical Exam: Vitals:   08/13/18 0949 08/13/18 0951 08/13/18 0952 08/13/18 1222  BP:  (!) 131/92 (!) 131/92 (!) 167/85  Pulse:  69 81 (!) 59  Resp:  16  20  Temp:  (!) 97.3 F (36.3 C)    TempSrc:  Oral    SpO2:  100% 97% 95%  Weight: 75.8 kg     Height: '5\' 11"'$  (1.803 m)        . General: He has advanced dementia, generally staring off into space and gnawing his fingernails without engagement.   . Eyes:  PERRL, EOMI, normal lids, iris . ENT:  grossly normal hearing, lips & tongue, mmm; poor dentition . Neck:  no LAD, masses or thyromegaly . Cardiovascular:  RRR, no m/r/g. No LE edema.  Marland Kitchen Respiratory:   CTA bilaterally with no wheezes/rales/rhonchi.  Normal respiratory effort. . Abdomen:  soft, NT, ND, NABS . Skin:  no rash or induration seen on limited exam; extensive seborrheic keratoses  . Musculoskeletal:  grossly normal tone BUE/BLE, good ROM, no bony  abnormality . Psychiatric: advanced dementia, oriented to person only, minimally able to answer questions and interact . Neurologic:  CN 2-12 grossly intact, moves all extremities in coordinated fashion, very difficult exam    Radiological Exams on Admission: Dg Chest 2 View  Result Date: 08/13/2018 CLINICAL DATA:  Fall  2 days ago. Hypertension. Dementia. Former smoker. EXAM: CHEST - 2 VIEW COMPARISON:  05/31/2014 FINDINGS: Cardiac silhouette is top-normal in size. No mediastinal or hilar masses. No evidence of adenopathy. Prominent bronchovascular markings in the bases. Lungs otherwise clear. No pleural effusion or pneumothorax. Skeletal structures are intact. IMPRESSION: No active cardiopulmonary disease. Electronically Signed   By: Lajean Manes M.D.   On: 08/13/2018 10:30   Dg Pelvis 1-2 Views  Result Date: 08/13/2018 CLINICAL DATA:  Fall 2 days ago.  Pain. EXAM: PELVIS - 1-2 VIEW COMPARISON:  06/01/2014 FINDINGS: No fracture or bone lesion. Hip joints, SI joints and symphysis pubis are normally aligned. Soft tissues are unremarkable. IMPRESSION: No fracture or dislocation Electronically Signed   By: Lajean Manes M.D.   On: 08/13/2018 10:31   Ct Head Wo Contrast  Result Date: 08/13/2018 CLINICAL DATA:  Altered mental status. EXAM: CT HEAD WITHOUT CONTRAST TECHNIQUE: Contiguous axial images were obtained from the base of the skull through the vertex without intravenous contrast. COMPARISON:  Brain [name], 07/13/2013 FINDINGS: Brain: There is focal hypoattenuation in the left medial occipital lobe, extending to the posterior inferior left parietal lobe, consistent with a recent left PCA distribution infarct. This involves the white matter and overlying cortical ribbon. There is no other evidence of a recent infarct old lacunar infarct is noted in the right thalamus. There is ventricular and sulcal enlargement reflecting moderate atrophy, increased when compared to the prior brain [name]. There are no  parenchymal masses. There is no midline shift. There are no extra-axial masses or abnormal fluid collections. There is no intracranial hemorrhage. Vascular: No hyperdense vessel or unexpected calcification. Skull: Normal. Negative for fracture or focal lesion. Sinuses/Orbits: Visualize globes and orbits are unremarkable. The visualized sinuses and mastoid air cells are clear. Other: None. IMPRESSION: 1. Recent, likely early subacute, left PCA distribution infarct. 2. No other acute or recent abnormalities. No intracranial hemorrhage. 3. Moderate atrophy.  Old right thalamic lacunar infarct. Electronically Signed   By: Lajean Manes M.D.   On: 08/13/2018 11:10    EKG: not done   Labs on Admission: I have personally reviewed the available labs and imaging studies at the time of the admission.  Pertinent labs:   Unremarkable CMP WBC 11.7, CBC otherwise WNL Unremarkable UA  Assessment/Plan Principal Problem:   CVA (cerebral vascular accident) [facility]) Active Problems:   Atrial fibrillation (HCC)   HTN (hypertension)   Alzheimer's disease ([place])   -Patient had advanced dementia prior to the CVA which appears to have happened Thursday -CT confirms a recent, subacute stroke, corresponding with his worsening symptoms -Based on our discussion, his quality of life was poor prior and is likely to be almost impossible now since he is unable to walk, transfer, feed himself -As such, the wife prefers to proceed with comfort measures only -Our only additional stroke evaluation will be for a bedside swallow evaluation - which is likely only minimally useful at this time -Patient may be a candidate for [employer] or other residential hospice, although he does not appear to be actively dying at this time, given his baseline low reserve and advanced dementia at baseline -If not a candidate for residential hospice, his wife is prepared to go home with home hospice - although he has behavioral challenges and  will require total care and this will be incredibly difficult for her -Comfort care order set utilized -Pain control with morphine as needed, would transition to a drip if needed but patient does not  appear to be in pain at this time -I have spoken with Venia Carbon from Hospice and she will come to evaluate the patient and to determine if he is residential hospice-appropriate -There is no bed available at [facility] at this time -Will admit for now since he has multiple acute medical problems that cannot be managed on an outpatient basis at this time     DVT prophylaxis: None - comfort care Code Status: DNR - confirmed with family Family Communication: Wife, her brother, and her sister-in-law were present throughout evaluation Disposition Plan: To be determined - likely residential hospice or home with hospice Consults called: Hospice  Admission status: Admit - It is my clinical opinion that admission to INPATIENT is reasonable and necessary because of the expectation that this patient will require hospital care that crosses at least 2 midnights to treat this condition based on the medical complexity of the problems presented.  Given the aforementioned information, the predictability of an adverse outcome is felt to be significant.    Karmen Bongo MD [facility]   How to contact the [facility] Attending or Consulting provider [place] or covering provider during after hours [place], for this patient?  1. Check the care team in [facility] and look for a) attending/consulting TR[facility] provider listed and b) the [facility] team listed 2. Log into [email or web address] and use [place]'s universal password to access. If you do not have the password, please contact the hospital operator. 3. Locate the [facility] provider you are looking for under [facility] and page to a number that you can be directly reached. 4. If you still have difficulty reaching the provider, please page the [facility] (Director on Call) for the  Hospitalists listed on amion for assistance.   08/13/2018, 2:36 PM

## 2018-08-13 NOTE — Progress Notes (Signed)
Civil engineer, contracting [facility])  Notified by Dr. Ophelia Charter, of family request for [facility] services at home after discharge.  Chart and patient information under review by [facility] physician, and eligibility is pending at this time.    Spoke with wife [name], to initiate education related to hospice philosophy, services and team approach to care.  [name] verbalized understanding of information given, she is overwhelmed but wanting to take her husband home for his end of life care.  Discharge plan tentative, DME needs to be ordered and delivered.        Please send signed and completed DNR form home with family.  Patient will need prescriptions for discharge comfort medications.  DME needs discussed, patient currently has the following equipment: walker  Patient will need the following equipment:  Bed.  [facility] will order equipment from [facility] and they will contact [name] to arrange delivery.   [facility] is aware of the above.  Completed discharge summary will need to be faxed to [facility] at [phone].  Please notify [facility] when patient is ready to leave the unit at discharge [phone] between 830-5, [phone] after 5).    [facility] information give to [facility]   Please call with any hospice related questions/concerns  Wallis Bamberg RN, BSN, CCRN [facility] Liaison [phone]

## 2018-08-13 NOTE — ED Provider Notes (Signed)
[facility] EMERGENCY DEPARTMENT Provider Note   CSN: [id number] Arrival date & time: 08/13/18  0944    History   Chief Complaint Chief Complaint  Patient presents with  . Fall    HPI Darren Morgan is a 78 y.o. male.     The history is provided by the patient. No language interpreter was used.  Fall  This is a new problem. The problem occurs constantly. The problem has been gradually worsening. Nothing aggravates the symptoms. Nothing relieves the symptoms. He has tried nothing for the symptoms. The treatment provided no relief.  EMS reports pt fell on Thursday.   They helped get pt in bed.  They report pt has had a decline and today could not get out of bed.   Past Medical History:  Diagnosis Date  . 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 Atrial fibrillation (HCC) 09/27/2013  . Dementia (HCC)   . Dementia (HCC) 12/07/2012  . High cholesterol   . Hypertension   . Memory loss     Patient Active Problem List   Diagnosis Date Noted  . 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 Urge and stress incontinence 12/31/2014  . Alzheimer's disease (HCC) 12/31/2014  . Ileitis 05/31/2014  . SBO (small bowel obstruction) (HCC) 05/31/2014  . Atrial fibrillation (HCC) 09/27/2013  . HTN (hypertension) 09/27/2013  . 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 Dementia (HCC) 12/07/2012    Past Surgical History:  Procedure Laterality Date  . KNEE SURGERY Right    KNEE CAP REPLACED        Home Medications    Prior to Admission medications   Medication Sig Start Date End Date Taking? Authorizing Provider  aspirin 81 MG tablet Take 81 mg by mouth daily.    [provider]  CARTIA XT 240 MG 24 hr capsule Take 240 mg by mouth daily. 05/05/16   [provider]  divalproex (DEPAKOTE ER) 250 MG 24 hr tablet Take 1 tablet (250 mg total) by mouth daily. 03/24/18   Butch Penny, NP  donepezil ([name]) 10 MG tablet Take 1 tablet (10 mg total) by mouth at bedtime. 01/17/18   Butch Penny, NP  lisinopril (PRINIVIL,ZESTRIL) 40 MG tablet  12/26/15   [provider]  memantine ([name] XR) 28 MG CP24 24 hr capsule TAKE 1 CAPSULE(28 MG) BY MOUTH DAILY 12/06/17   York Spaniel, MD  Multiple Vitamins-Minerals (CENTRUM SILVER PO) Take by mouth daily.    [provider]  traZODone (DESYREL) 50 MG tablet TAKE 1 TABLET BY MOUTH AT BEDTIME 07/27/18   York Spaniel, MD    Family History Family History  Problem Relation Age of Onset  . Alzheimer's disease Mother   . Alzheimer's disease Maternal Aunt     Social History Social History   Tobacco Use  . Smoking status: Former Games developer  . Smokeless tobacco: Never Used  Substance Use Topics  . Alcohol use: No  . Drug use: No     Allergies   Patient has no known allergies.   Review of Systems Review of Systems  All other systems reviewed and are negative.    Physical Exam Updated Vital Signs BP (!) 167/85 (BP Location: Right Arm)   Pulse (!) 59   Temp (!) 97.3 F (36.3 C) (Oral)   Resp 20   Ht 5\' 11"  (1.803 m)   Wt 75.8 kg   SpO2 95%  BMI 23.31 kg/m   Physical Exam Vitals signs reviewed.  HENT:     Head: Normocephalic.     Right Ear: External ear normal.     Left Ear: External ear normal.     Nose: Nose normal.     Mouth/Throat:     Mouth: Mucous membranes are moist.  Eyes:     Extraocular Movements: Extraocular movements intact.     Pupils: Pupils are equal, round, and reactive to light.  Neck:     Musculoskeletal: Normal range of motion.  Cardiovascular:     Rate and Rhythm: Normal rate.     Pulses: Normal pulses.  Pulmonary:     Effort: Pulmonary effort is normal.  Abdominal:     General: Abdomen is flat.  Musculoskeletal: Normal range of motion.  Skin:    General: Skin is warm.  Neurological:     General: No focal deficit present.     Mental Status: He  is disoriented.      ED Treatments / Results  Labs (all labs ordered are listed, but only abnormal results are displayed) Labs Reviewed  CBC WITH DIFFERENTIAL/PLATELET - Abnormal; Notable for the following components:      Result Value   WBC 11.7 (*)    All other components within normal limits  COMPREHENSIVE METABOLIC PANEL - Abnormal; Notable for the following components:   Glucose, Bld 100 (*)    All other components within normal limits  URINALYSIS, ROUTINE W REFLEX MICROSCOPIC    EKG None  Radiology Dg Chest 2 View  Result Date: 08/13/2018 CLINICAL DATA:  Fall 2 days ago. Hypertension. Dementia. Former smoker. EXAM: CHEST - 2 VIEW COMPARISON:  05/31/2014 FINDINGS: Cardiac silhouette is top-normal in size. No mediastinal or hilar masses. No evidence of adenopathy. Prominent bronchovascular markings in the bases. Lungs otherwise clear. No pleural effusion or pneumothorax. Skeletal structures are intact. IMPRESSION: No active cardiopulmonary disease. Electronically Signed   By: Amie Portland M.D.   On: 08/13/2018 10:30   Dg Pelvis 1-2 Views  Result Date: 08/13/2018 CLINICAL DATA:  Fall 2 days ago.  Pain. EXAM: PELVIS - 1-2 VIEW COMPARISON:  06/01/2014 FINDINGS: No fracture or bone lesion. Hip joints, SI joints and symphysis pubis are normally aligned. Soft tissues are unremarkable. IMPRESSION: No fracture or dislocation Electronically Signed   By: Amie Portland M.D.   On: 08/13/2018 10:31   Ct Head Wo Contrast  Result Date: 08/13/2018 CLINICAL DATA:  Altered mental status. EXAM: CT HEAD WITHOUT CONTRAST TECHNIQUE: Contiguous axial images were obtained from the base of the skull through the vertex without intravenous contrast. COMPARISON:  Brain [name], 07/13/2013 FINDINGS: Brain: There is focal hypoattenuation in the left medial occipital lobe, extending to the posterior inferior left parietal lobe, consistent with a recent left PCA distribution infarct. This involves the white matter  and overlying cortical ribbon. There is no other evidence of a recent infarct old lacunar infarct is noted in the right thalamus. There is ventricular and sulcal enlargement reflecting moderate atrophy, increased when compared to the prior brain [name]. There are no parenchymal masses. There is no midline shift. There are no extra-axial masses or abnormal fluid collections. There is no intracranial hemorrhage. Vascular: No hyperdense vessel or unexpected calcification. Skull: Normal. Negative for fracture or focal lesion. Sinuses/Orbits: Visualize globes and orbits are unremarkable. The visualized sinuses and mastoid air cells are clear. Other: None. IMPRESSION: 1. Recent, likely early subacute, left PCA distribution infarct. 2. No other acute or recent abnormalities. No  intracranial hemorrhage. 3. Moderate atrophy.  Old right thalamic lacunar infarct. Electronically Signed   By: Amie Portland M.D.   On: 08/13/2018 11:10    Procedures Procedures (including critical care time)  Medications Ordered in ED Medications - No data to display   Initial Impression / Assessment and Plan / ED Course  I have reviewed the triage vital signs and the nursing notes.  Pertinent labs & imaging results that were available during my care of the patient were reviewed by me and considered in my medical decision making (see chart for details).        MDM  Pt's wife reports she thinks pt had a stroke on Thursday.  Pt has been able to stand, use a walker and eat.  Pt is now unable to get out of bed.  She reports pt dramatically worse.  Ct scan shows subacute cva Pt in a no code.  Wife is interested in hospice.  Primary care is Arron Kateri Plummer. He has seen Dr. Marylou Flesher for neurology in the past.   Final Clinical Impressions(s) / ED Diagnoses   Final diagnoses:  Cerebrovascular accident (CVA), unspecified mechanism [facility])    ED Discharge Orders    None       Elson Areas, [place] 08/13/18 1358    Little, Ambrose Finland, MD 08/17/18 1401

## 2018-08-14 MED ORDER — DIVALPROEX SODIUM ER 250 MG PO TB24
250.0000 mg | ORAL_TABLET | Freq: Every day | ORAL | Status: DC
  Administered 2018-08-14: 250 mg via ORAL
  Filled 2018-08-14 (×4): qty 1

## 2018-08-14 MED ORDER — TRAZODONE HCL 50 MG PO TABS
50.0000 mg | ORAL_TABLET | Freq: Every day | ORAL | Status: DC
  Administered 2018-08-14 – 2018-08-15 (×2): 50 mg via ORAL
  Filled 2018-08-14 (×2): qty 1

## 2018-08-14 MED ORDER — LORAZEPAM 2 MG/ML IJ SOLN
1.0000 mg | INTRAMUSCULAR | Status: DC | PRN
  Administered 2018-08-16: 1 mg via INTRAVENOUS
  Filled 2018-08-14: qty 1

## 2018-08-14 MED ORDER — ASPIRIN EC 81 MG PO TBEC
81.0000 mg | DELAYED_RELEASE_TABLET | Freq: Every day | ORAL | Status: DC
  Administered 2018-08-14: 81 mg via ORAL
  Filled 2018-08-14: qty 1

## 2018-08-14 NOTE — Progress Notes (Signed)
Civil engineer, contracting [facility])  Hospital Liaison note.  Hospice eligibility approved for home hospice. A Palliative medical team consult has been requested by hospitalists to assess GOC and determine if residential hospice is more appropriate.  [facility] notified of possible referral.   Spoke with wife, Darren Morgan at bedside. She is agreeable to residential hospice at [facility] if he is eligible.   [facility] hospital liaison will follow up on 08/15/2018.  Please call with any hospice related questions/concerns  Wallis Bamberg RN, BSN, CCRN [facility] Liaison [phone]

## 2018-08-14 NOTE — Progress Notes (Signed)
PROGRESS NOTE    Darren Morgan  MMH:[id number] DOB: 24-Dec-1940 DOA: 08/13/2018 PCP: Farris Has, MD    Brief Narrative: 78 year old with past medical history significant for dementia, hypertension, A. fib who presented with weakness.  Patient was not following, 9 he was not able to eat his breakfast he was more weak to the point that he fell.  Admitting physician discussed with wife the goal is for comfort care.  Patient would like hospice at home.  CT head showed a subacute stroke  Assessment & Plan:   Principal Problem:   CVA (cerebral vascular accident) [facility]) Active Problems:   Atrial fibrillation (HCC)   HTN (hypertension)   Alzheimer's disease (HCC)  1-subacute stroke: After discussion with wife the goal is for comfort care. We are trying to arrange hospice at home. Resume home aspirin. No further work-up as per family request. Awaiting for hospice arrangement at home.  2-agitation: Patient is get very agitated.  Will resume his Depakote.  We will also resume trazodone at bedtime. Continue with haloperidol as needed, Ativan as needed.  3-A. fib He was not on anticoagulation prior to admission. Goal is for comfort. Resume cartia when BP allows.  Estimated body mass index is 23.31 kg/m as calculated from the following:   Height as of this encounter: 5\' 11"  (1.803 m).   Weight as of this encounter: 75.8 kg.   DVT prophylaxis: SCD Code Status: DNR Family Communication:  Disposition Plan: to be determine.   Consultants:      Procedures:   none   Antimicrobials:   none   Subjective: Sleepy, was able to say few words.    Objective: Vitals:   08/13/18 1605 08/13/18 1754 08/13/18 1848 08/13/18 2000  BP: (!) 165/83 (!) 170/93 (!) 171/93   Pulse: 61 66 67   Resp: 18 18 17    Temp:  97.7 F (36.5 C) 97.9 F (36.6 C)   TempSrc:  Oral Axillary   SpO2: 97% 96% 97%   Weight:    75.8 kg  Height:    5\' 11"  (1.803 m)   No intake or output data in  the 24 hours ending 08/14/18 0741 Filed Weights   08/13/18 0949 08/13/18 2000  Weight: 75.8 kg 75.8 kg    Examination:  General exam: Appears calm and comfortable  Respiratory system: Clear to auscultation. Respiratory effort normal. Cardiovascular system: S1 & S2 heard, RRR. No JVD, murmurs, rubs, gallops or clicks. No pedal edema. Gastrointestinal system: Abdomen is nondistended, soft and nontender. No organomegaly or masses felt. Normal bowel sounds heard. Central nervous system: sleepy, say few words Extremities: no edema  Data Reviewed: I have personally reviewed following labs and imaging studies  CBC: Recent Labs  Lab 08/13/18 0956  WBC 11.7*  NEUTROABS 7.6  HGB 15.4  HCT 47.8  MCV 100.0  PLT 224   Basic Metabolic Panel: Recent Labs  Lab 08/13/18 0956  NA 142  K 4.1  CL 104  CO2 26  GLUCOSE 100*  BUN 10  CREATININE 1.05  CALCIUM 9.3   GFR: Estimated Creatinine Clearance: 63.7 mL/min (by C-G formula based on SCr of 1.05 mg/dL). Liver Function Tests: Recent Labs  Lab 08/13/18 0956  AST 27  ALT 19  ALKPHOS 102  BILITOT 0.7  PROT 7.0  ALBUMIN 3.7   No results for input(s): LIPASE, AMYLASE in the last 168 hours. No results for input(s): AMMONIA in the last 168 hours. Coagulation Profile: No results for input(s): INR, PROTIME in the  last 168 hours. Cardiac Enzymes: No results for input(s): CKTOTAL, CKMB, CKMBINDEX, TROPONINI in the last 168 hours. BNP (last 3 results) No results for input(s): PROBNP in the last 8760 hours. HbA1C: No results for input(s): HGBA1C in the last 72 hours. CBG: No results for input(s): GLUCAP in the last 168 hours. Lipid Profile: No results for input(s): CHOL, HDL, LDLCALC, TRIG, CHOLHDL, LDLDIRECT in the last 72 hours. Thyroid Function Tests: No results for input(s): TSH, T4TOTAL, FREET4, T3FREE, THYROIDAB in the last 72 hours. Anemia Panel: No results for input(s): VITAMINB12, FOLATE, FERRITIN, TIBC, IRON, RETICCTPCT  in the last 72 hours. Sepsis Labs: No results for input(s): PROCALCITON, LATICACIDVEN in the last 168 hours.  No results found for this or any previous visit (from the past 240 hour(s)).       Radiology Studies: Dg Chest 2 View  Result Date: 08/13/2018 CLINICAL DATA:  Fall 2 days ago. Hypertension. Dementia. Former smoker. EXAM: CHEST - 2 VIEW COMPARISON:  05/31/2014 FINDINGS: Cardiac silhouette is top-normal in size. No mediastinal or hilar masses. No evidence of adenopathy. Prominent bronchovascular markings in the bases. Lungs otherwise clear. No pleural effusion or pneumothorax. Skeletal structures are intact. IMPRESSION: No active cardiopulmonary disease. Electronically Signed   By: Amie Portland M.D.   On: 08/13/2018 10:30   Dg Pelvis 1-2 Views  Result Date: 08/13/2018 CLINICAL DATA:  Fall 2 days ago.  Pain. EXAM: PELVIS - 1-2 VIEW COMPARISON:  06/01/2014 FINDINGS: No fracture or bone lesion. Hip joints, SI joints and symphysis pubis are normally aligned. Soft tissues are unremarkable. IMPRESSION: No fracture or dislocation Electronically Signed   By: Amie Portland M.D.   On: 08/13/2018 10:31   Ct Head Wo Contrast  Result Date: 08/13/2018 CLINICAL DATA:  Altered mental status. EXAM: CT HEAD WITHOUT CONTRAST TECHNIQUE: Contiguous axial images were obtained from the base of the skull through the vertex without intravenous contrast. COMPARISON:  Brain [name], 07/13/2013 FINDINGS: Brain: There is focal hypoattenuation in the left medial occipital lobe, extending to the posterior inferior left parietal lobe, consistent with a recent left PCA distribution infarct. This involves the white matter and overlying cortical ribbon. There is no other evidence of a recent infarct old lacunar infarct is noted in the right thalamus. There is ventricular and sulcal enlargement reflecting moderate atrophy, increased when compared to the prior brain [name]. There are no parenchymal masses. There is no midline  shift. There are no extra-axial masses or abnormal fluid collections. There is no intracranial hemorrhage. Vascular: No hyperdense vessel or unexpected calcification. Skull: Normal. Negative for fracture or focal lesion. Sinuses/Orbits: Visualize globes and orbits are unremarkable. The visualized sinuses and mastoid air cells are clear. Other: None. IMPRESSION: 1. Recent, likely early subacute, left PCA distribution infarct. 2. No other acute or recent abnormalities. No intracranial hemorrhage. 3. Moderate atrophy.  Old right thalamic lacunar infarct. Electronically Signed   By: Amie Portland M.D.   On: 08/13/2018 11:10        Scheduled Meds: Continuous Infusions:   LOS: 1 day    Time spent: 35 minutes./     Alba Cory, MD [facility]  08/14/2018, 7:41 AM

## 2018-08-14 NOTE — Progress Notes (Signed)
SLP Cancellation Note  Patient Details Name: Darren Morgan MRN: [id number] DOB: 1941-02-07   Cancelled treatment:       Reason Eval/Treat Not Completed: SLP screened, no needs identified, will sign off. Per charting pt passed [employer] Screen. Tolerating diet and pills, per RN. Hospice referral pending. SLP will s/o.  [name], [place], CCC-SLP Speech-Language Pathologist Acute Rehabilitation Services Pager: [phone] Office: [phone]    Darren Morgan 08/14/2018, 4:00 PM

## 2018-08-15 DIAGNOSIS — Z7189 Other specified counseling: Secondary | ICD-10-CM

## 2018-08-15 DIAGNOSIS — Z515 Encounter for palliative care: Secondary | ICD-10-CM

## 2018-08-15 DIAGNOSIS — Z66 Do not resuscitate: Secondary | ICD-10-CM

## 2018-08-15 MED ORDER — MORPHINE SULFATE (CONCENTRATE) 10 MG/0.5ML PO SOLN: 5.0000 mg | ORAL | Status: DC | PRN

## 2018-08-15 NOTE — Progress Notes (Signed)
   08/15/18 1600  Clinical Encounter Type  Visited With Patient and family together  Visit Type Initial;Spiritual support;Psychological support;Patient actively dying  Referral From Other (Comment) (NP)  Consult/Referral To Chaplain  Spiritual Encounters  Spiritual Needs Prayer;Emotional;Grief support  Stress Factors  Family Stress Factors Exhausted;Major life changes   Responded to Epic consult for EOL.  Met w/ pt and spouse at bedside.  Pt not very interactive, which was typical per spouse.  Empathetic listening to spouse, supported her story-telling about how God has led her and been a source of strength through the years.  She said pt was going elsewhere (to hospice?) in the morning.  I will let palliative chaplain know to ck in if pt still here in am.  Mrs. Geisinger has experienced death of her parents, her 72 yo daughter (3 yrs ago), and her brother a few months ago.  She and pt have been married 40 years later this month.  Myra Gianotti resident, [phone]

## 2018-08-15 NOTE — Clinical Social Work Note (Signed)
Clinical Social Work Assessment  Patient Details  Name: Darren Morgan MRN: [id number] Date of Birth: 03/27/1941  Date of referral:  08/15/18               Reason for consult:  Facility Placement                Permission sought to share information with:  Facility Sport and exercise psychologist, Family Supports Permission granted to share information::  Yes, Verbal Permission Granted  Name::     Information systems manager::  [employer]  Relationship::  Wife  Contact Information:     Housing/Transportation Living arrangements for the past 2 months:  [place] of Information:  Medical Team, Spouse Patient Interpreter Needed:  None Criminal Activity/Legal Involvement Pertinent to Current Situation/Hospitalization:  No - Comment as needed Significant Relationships:  Spouse Lives with:  Self, Spouse Do you feel safe going back to the place where you live?  Yes Need for family participation in patient care:  Yes (Comment)  Care giving concerns:  Patient at end of life and family requesting transfer to residential hospice facility.   Social Worker assessment / plan:  CSW alerted by admissions at [facility] that they will have a bed available for the patient tomorrow. CSW met with patient's wife at bedside to confirm plan to admit to [facility] and assist with coordinating time to complete paperwork. CSW to continue to follow.  Employment status:  Retired Nurse, adult PT Recommendations:  Not assessed at this time Information / Referral to community resources:     Patient/Family's Response to care:  Patient's wife agreeable to transfer to residential hospice.  Patient/Family's Understanding of and Emotional Response to Diagnosis, Current Treatment, and Prognosis:  Patient's wife is at peace with decision to pursue end of life care, patient has been declining for some time. Patient's wife hopeful for transfer to residential as opposed to home hospice as she is  exhausted from being the primary caregiver for the patient.  Emotional Assessment Appearance:  Appears stated age Attitude/Demeanor/Rapport:  Unable to Assess Affect (typically observed):  Unable to Assess Orientation:  Oriented to Self Alcohol / Substance use:  Not Applicable Psych involvement (Current and /or in the community):  No (Comment)  Discharge Needs  Concerns to be addressed:  Care Coordination Readmission within the last 30 days:  No Current discharge risk:  Terminally ill Barriers to Discharge:  Hospice Bed not available   [name], LCSW 08/15/2018, 12:08 PM

## 2018-08-15 NOTE — Progress Notes (Signed)
PROGRESS NOTE    Darren Morgan  PBD:[id number] DOB: 1940/07/27 DOA: 08/13/2018 PCP: Farris Has, MD    Brief Narrative: 78 year old with past medical history significant for dementia, hypertension, A. fib who presented with weakness.  Patient was not following, 9 he was not able to eat his breakfast he was more weak to the point that he fell.  Admitting physician discussed with wife the goal is for comfort care.  Patient would like hospice at home.  CT head showed a subacute stroke  Assessment & Plan:   Principal Problem:   CVA (cerebral vascular accident) [facility]) Active Problems:   Atrial fibrillation (HCC)   HTN (hypertension)   Alzheimer's disease (HCC)  1-Subacute stroke: After discussion with wife the goal is for comfort care. We are trying to arrange hospice at home. Resume home aspirin. No further work-up as per family request. Palliative consulted. Patient is appropriate for residential hospice.  Referral made.   2-agitation: Patient is get very agitated.  Will resume his Depakote.  We will also resume trazodone at bedtime. Continue with haloperidol as needed, Ativan as needed. More calm today. He was abel to sleep last night.   3-A. fib He was not on anticoagulation prior to admission. Goal is for comfort. Resume cartia when BP allows.  Estimated body mass index is 23.31 kg/m as calculated from the following:   Height as of this encounter: 5\' 11"  (1.803 m).   Weight as of this encounter: 75.8 kg.   DVT prophylaxis: SCD Code Status: DNR Family Communication:  Disposition Plan: to be determine.   Consultants:      Procedures:   none   Antimicrobials:   none   Subjective: Open eyes, say few words. Fall back sleep.  Ate few bites breakfast   Objective: Vitals:   08/14/18 2021 08/14/18 2347 08/15/18 0506 08/15/18 0825  BP: (!) 171/86 (!) 85/68 102/75 121/84  Pulse: 71 60 74 79  Resp: 18 16 16 16   Temp: 98 F (36.7 C) (!) 97.5 F (36.4  C) 98.1 F (36.7 C) 98.5 F (36.9 C)  TempSrc: Oral Oral Oral   SpO2: 99% 95% 99% 97%  Weight:      Height:        Intake/Output Summary (Last 24 hours) at 08/15/2018 1301 Last data filed at 08/15/2018 0514 Gross per 24 hour  Intake -  Output 150 ml  Net -150 ml   Filed Weights   08/13/18 0949 08/13/18 2000  Weight: 75.8 kg 75.8 kg    Examination:  General exam:NAD Respiratory system: CTA Cardiovascular system: S 1, S 2 RRR Gastrointestinal system: BS present soft, nt Central nervous system: sleepy  Extremities: no edema  Data Reviewed: I have personally reviewed following labs and imaging studies  CBC: Recent Labs  Lab 08/13/18 0956  WBC 11.7*  NEUTROABS 7.6  HGB 15.4  HCT 47.8  MCV 100.0  PLT 224   Basic Metabolic Panel: Recent Labs  Lab 08/13/18 0956  NA 142  K 4.1  CL 104  CO2 26  GLUCOSE 100*  BUN 10  CREATININE 1.05  CALCIUM 9.3   GFR: Estimated Creatinine Clearance: 63.7 mL/min (by C-G formula based on SCr of 1.05 mg/dL). Liver Function Tests: Recent Labs  Lab 08/13/18 0956  AST 27  ALT 19  ALKPHOS 102  BILITOT 0.7  PROT 7.0  ALBUMIN 3.7   No results for input(s): LIPASE, AMYLASE in the last 168 hours. No results for input(s): AMMONIA in the last 168  hours. Coagulation Profile: No results for input(s): INR, PROTIME in the last 168 hours. Cardiac Enzymes: No results for input(s): CKTOTAL, CKMB, CKMBINDEX, TROPONINI in the last 168 hours. BNP (last 3 results) No results for input(s): PROBNP in the last 8760 hours. HbA1C: No results for input(s): HGBA1C in the last 72 hours. CBG: No results for input(s): GLUCAP in the last 168 hours. Lipid Profile: No results for input(s): CHOL, HDL, LDLCALC, TRIG, CHOLHDL, LDLDIRECT in the last 72 hours. Thyroid Function Tests: No results for input(s): TSH, T4TOTAL, FREET4, T3FREE, THYROIDAB in the last 72 hours. Anemia Panel: No results for input(s): VITAMINB12, FOLATE, FERRITIN, TIBC, IRON,  RETICCTPCT in the last 72 hours. Sepsis Labs: No results for input(s): PROCALCITON, LATICACIDVEN in the last 168 hours.  No results found for this or any previous visit (from the past 240 hour(s)).       Radiology Studies: No results found.      Scheduled Meds: . divalproex  250 mg Oral Daily  . traZODone  50 mg Oral QHS   Continuous Infusions:   LOS: 2 days    Time spent: 35 minutes./     Alba Cory, MD [facility]  08/15/2018, 1:01 PM

## 2018-08-15 NOTE — Consult Note (Signed)
Consultation Note Date: 08/16/2018   Patient Name: Darren Morgan  DOB: 21-Mar-1941  MRN: [id number]  Age / Sex: 78 y.o., male  PCP: [name], MD Referring Physician: Elmarie Shiley, MD  Reason for Consultation: Establishing goals of care  HPI/Patient Profile: 78 y.o. male admitted on 08/13/2018 from home with complaints of generalized weakness. He has a past medical history of advanced dementia, hyperlipidemia, hypertension, and atrial fibrillation. During ED course wife reported patient was unable to feed himself. The evening prior to admission when he attempted to stand he lost his balance and fell. She reports patient is generally strong enough to stand and transfer or ambulate with a walker and also feeds himself after set-up. She reported the day of admission she could not get him up out of the bed. Head CT showed a subacute CVA. Chest x-ray was negative for acute abnormalities. Since admission patient has declined further work-up. He continues to show signs of decline, weakness, with poor appetite. Palliative Medicine team consulted for goals of care.    Clinical Assessment and Goals of Care: I have reviewed medical records including lab results, imaging, Epic notes, and MAR, received report from the bedside RN, and assessed the patient. I met at the bedside with patient's wife, Darren Morgan to discuss diagnosis prognosis, [place], EOL wishes, disposition and options. Patient is somewhat lethargic. He will not open eyes, follow commands, or respond verbally to name or stimuli. Wife reports he will awaken intermittently and take a few sips or bites and will take medications at times.   I introduced Palliative Medicine as specialized medical care for people living with serious illness. It focuses on providing relief from the symptoms and stress of a serious illness. The goal is to improve quality of life for  both the patient and the family.  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 She report as the industry declined and they began downsizing his co-manager was being laid off and Darren Morgan knew he had 4 small children at home so he voluntarily opted to get laid off to save the job of his colleague. They had have been married for 39 years and their anniversary is March 15th. She is tearful and expressed she would love to celebrate 85 years although he wouldn't know one way or another. They have 2 cats. They had 1 daughter and unfortunately she passed away 3 years ago due to diabetes complications. He enjoyed golfing and watching sports.   As far as functional and nutritional status prior to admission Darren Morgan was patient's caregiver. She reported he required assistance with ADLs and was incontinent x2. He has suffered from Alzheimer's for the past 9 years and has noticed a more severe decline over the past few months. She reports he was able to feed himself after set-up and food cut in smaller pieces. He could also ambulate with walker short distances and stand, turn, and  pivot. She reports his appetite has decreased over the past year. She estimates he has lost over 65lbs in the past year. He has also been sleeping more and showing signs of agitation if routines are changed.   We discussed his current illness and what it means in the larger context of his on-going co-morbidities.  Natural disease trajectory and expectations at EOL were discussed. Wife tearful and verbalized understanding. She expressed she was aware of signs of decline even prior to this admission.   I attempted to elicit values and goals of care important to the patient.    Wife expresses her goal is for comfort and EOL care. She reports she was not interested in medical interventions and work-up as she has watched him decline and does not  want him to suffer or be put through test and procedures knowing that is only prolonging the process. Support and comfort given.   Wife confirms DNR/DNI and goal of full comfort measures with awareness of death as outcome.   Hospice services outpatient were explained and offered. Wife verbalized awareness and previous communication with hospice nurse. Wife verbalized her understanding and awareness of hospice's goals and philosophy of care. She expressed if patient meets criteria she would like him to be considered for residential hospice versus home with hospice. She has provided care for him over the past 9 years and emotionally and physically she is suffering for caregiver strain with lack of support.   Questions and concerns were addressed.  The family was encouraged to call with questions or concerns.  PMT will continue to support holistically.   Primary Decision Maker:  HCPOA/WIFE: Darren Morgan     SUMMARY OF RECOMMENDATIONS    DNR/DNI  Full Comfort Measures  Wife requesting consideration for [facility] for EOL care.   CSW referral for [place] with full comfort measures as initiated by attending  Will d/c medications not comfort focused   Roxanol PRN for pain/shortness of breath  Apply condom cath for skin protection  Requested patient to be shaved  PMT will continue to support and follow   Code Status/Advance Care Planning:  DNR/DNI   Symptom Management:   Morphine PRN for pain/shortness of breath  Robinul PRN for excessive secretions  Haldol PRN for agitation  Continue Trazodone for sleep at bedtime if patient can tolerate po's  Palliative Prophylaxis:   Aspiration, Bowel Regimen, Delirium Protocol, Frequent Pain Assessment and Oral Care  Additional Recommendations (Limitations, Scope, Preferences):  Full Comfort Care  Psycho-social/Spiritual:   Desire for further Chaplaincy support:YES   Additional Recommendations: Caregiving   Support/Resources  Prognosis:   Hours - Days-Full comfort care   Discharge Planning: Hospice facility      Primary Diagnoses: Present on Admission: . CVA (cerebral vascular accident) ([place]) . Alzheimer's disease ([place]) . Atrial fibrillation ([place]) . HTN (hypertension)   I have reviewed the medical record, interviewed the patient and family, and examined the patient. The following aspects are pertinent.  Past Medical History:  Diagnosis Date  . 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 Atrial fibrillation ([place]) 09/27/2013  . Dementia ([place])   . Dementia ([place]) 12/07/2012  . High cholesterol   . Hypertension   . Memory loss    Social History   Socioeconomic History  . Marital status: Married    Spouse name: Darren  . Number of children: 1  . Years of education: college  . Highest education level: Not on file  Occupational History  .  Occupation: RETIRED- Haematologist Needs  . Financial resource strain: Not on file  . Food insecurity:    Worry: Not on file    Inability: Not on file  . Transportation needs:    Medical: Not on file    Non-medical: Not on file  Tobacco Use  . Smoking status: Former Research scientist (life sciences)  . Smokeless tobacco: Never Used  Substance and Sexual Activity  . Alcohol use: No  . Drug use: No  . Sexual activity: Not on file  Lifestyle  . Physical activity:    Days per week: Not on file    Minutes per session: Not on file  . Stress: Not on file  Relationships  . Social connections:    Talks on phone: Not on file    Gets together: Not on file    Attends religious service: Not on file    Active member of club or organization: Not on file    Attends meetings of clubs or organizations: Not on file    Relationship status: Not on file  Other Topics Concern  . Not on file  Social History Narrative   [name], right handed . retired. Lives at home with his wife of 77 years, Remo Lipps. They have one grown child. Has three sodas a day.  Denies alcohol, tobacco and drug use. pt consumes tea ,diet cola once a day.         Family History  Problem Relation Age of Onset  . Alzheimer's disease Mother   . Alzheimer's disease Maternal Aunt    Scheduled Meds: . divalproex  250 mg Oral Daily  . traZODone  50 mg Oral QHS   Continuous Infusions: PRN Meds:.acetaminophen **OR** acetaminophen, antiseptic oral rinse, bisacodyl, diphenhydrAMINE, glycopyrrolate **OR** [DISCONTINUED] glycopyrrolate **OR** glycopyrrolate, haloperidol **OR** haloperidol **OR** haloperidol lactate, LORazepam, morphine injection, morphine CONCENTRATE, ondansetron **OR** ondansetron (ZOFRAN) IV, polyvinyl alcohol Medications Prior to Admission:  Prior to Admission medications   Medication Sig Start Date End Date Taking? Authorizing Provider  aspirin 81 MG tablet Take 81 mg by mouth daily.   Yes [provider]  CARTIA XT 240 MG 24 hr capsule Take 240 mg by mouth daily. 05/05/16  Yes [provider]  divalproex (DEPAKOTE ER) 250 MG 24 hr tablet Take 1 tablet (250 mg total) by mouth daily. 03/24/18  Yes Ward Givens, NP  donepezil ([name]) 10 MG tablet Take 1 tablet (10 mg total) by mouth at bedtime. 01/17/18  Yes Ward Givens, NP  lisinopril (PRINIVIL,ZESTRIL) 40 MG tablet Take 40 mg by mouth daily.  12/26/15  Yes [provider]  memantine ([name] XR) 28 MG CP24 24 hr capsule TAKE 1 CAPSULE(28 MG) BY MOUTH DAILY Patient taking differently: Take 28 mg by mouth daily.  12/06/17  Yes Kathrynn Ducking, MD  Multiple Vitamins-Minerals (CENTRUM SILVER PO) Take 1 tablet by mouth daily.    Yes [provider]  traZODone (DESYREL) 50 MG tablet TAKE 1 TABLET BY MOUTH AT BEDTIME Patient taking differently: Take 50 mg by mouth at bedtime.  07/27/18  Yes Kathrynn Ducking, MD   No Known Allergies Review of Systems  Unable to perform ROS: Dementia    Physical Exam Vitals signs and nursing note reviewed.  Constitutional:       Appearance: He is cachectic. He is ill-appearing.  Cardiovascular:     Rate and Rhythm: Normal rate.  Pulmonary:     Effort: Pulmonary effort is normal.     Breath sounds: Normal breath sounds.  Abdominal:     General: Bowel sounds are normal.     Palpations: Abdomen is soft.  Skin:    General: Skin is warm and dry.     Findings: Bruising present.  Neurological:     Mental Status: He is lethargic.     Motor: Weakness and atrophy present.     Comments: Unable to assess mental status, not responding or following commands   Psychiatric:        Cognition and Memory: Cognition is impaired. Memory is impaired.        Judgment: Judgment is inappropriate.    Vital Signs: BP 124/85 (BP Location: Left Arm)   Pulse 74   Temp (!) 97.3 F (36.3 C) (Axillary)   Resp 16   Ht '5\' 11"'$  (1.803 m)   Wt 75.8 kg   SpO2 95%   BMI 23.31 kg/m  Pain Scale: Faces   Pain Score: 0-No pain   SpO2: SpO2: 95 % O2 Device:SpO2: 95 % O2 Flow Rate: .   IO: Intake/output summary:   Intake/Output Summary (Last 24 hours) at 08/16/2018 1855 Last data filed at 08/16/2018 0809 Gross per 24 hour  Intake -  Output 150 ml  Net -150 ml    LBM: Last BM Date: 08/12/18 Baseline Weight: Weight: 75.8 kg Most recent weight: Weight: 75.8 kg     Palliative Assessment/Data: ACTIVELY DYING     Flowsheet Rows     Most Recent Value  Intake Tab  Referral Department  Hospitalist  Unit at Time of Referral  Med/Surg Unit  Palliative Care Primary Diagnosis  Neurology  Date Notified  08/14/18  Palliative Care Type  New Palliative care  Reason for referral  Clarify Goals of Care, Counsel Regarding Hospice  Date of Admission  08/13/18  Date first seen by Palliative Care  08/14/18  # of days Palliative referral response time  0 Day(s)  # of days IP prior to Palliative referral  1  Clinical Assessment  Psychosocial & Spiritual Assessment  Palliative Care Outcomes      Time In: 0900 Time Out: 1015 Time Total: 75  min  Greater than 50%  of this time was spent counseling and coordinating care related to the above assessment and plan.  Signed by:  Alda Lea, AGPCNP-BC Palliative Medicine Team  Pager: [phone] Amion: Bjorn Pippin    Please contact Palliative Medicine Team phone at [phone] for questions and concerns.  For individual provider: See Shea Evans

## 2018-08-15 NOTE — Progress Notes (Signed)
[facility]) Hospital Liaison RN note:  Received request from [place] Rexene Alberts) for family interest in [facility] with request for transfer today.Chart reviewed. Met with patient's  spouse Remo Lipps) to confirm interest and explain services. Patient spouse agreeable to transfer 08/16/18 when bed available. CSW aware.  Registration paper work completed.. Dr. Orpah Melter to assume care per spouse request.  Please fax discharge summary to [phone].   On 08/16/18 - RN please call report to [phone] before discharge from unit. Please arrange transport for patient to arrive tomorrow before noon if possible.  Thank you.  Gar Ponto, RN [facility] Liaison  [phone] [facility]ias[facility] are on [place])

## 2018-08-16 ENCOUNTER — Ambulatory Visit: Payer: Medicare Other | Admitting: Nurse Practitioner

## 2018-08-16 MED ORDER — GLYCOPYRROLATE 1 MG PO TABS: 1.0000 mg | ORAL_TABLET | ORAL | 0 refills | Status: AC | PRN

## 2018-08-16 MED ORDER — MORPHINE SULFATE (CONCENTRATE) 10 MG/0.5ML PO SOLN: 5.0000 mg | ORAL | 0 refills | Status: AC | PRN

## 2018-08-16 MED ORDER — HALOPERIDOL 0.5 MG PO TABS: 0.5000 mg | ORAL_TABLET | ORAL | 0 refills | Status: AC | PRN

## 2018-08-16 NOTE — Discharge Summary (Signed)
Physician Discharge Summary  Darren Morgan BJS:[id number] DOB: [birth year] DOA: 08/13/2018  PCP: [name], MD  Admit date: 08/13/2018 Discharge date: 08/16/2018  Admitted From: Home  Disposition:  Residential Hospice.   Recommendations for Outpatient Follow-up:  1. Transfer to residential hospice for comfort care.    Discharge Condition: guarded, hospice CODE STATUS:DNR Diet recommendation: comfort feeding.   Brief/Interim Summary: -year-old with past medical history significant for dementia, hypertension, A. fib who presented with weakness.  Patient was not following, 9 he was not able to eat his breakfast he was more weak to the point that he fell.  Admitting physician discussed with wife the goal is for comfort care. CT head showed a subacute stroke.   1-Subacute stroke: After discussion with wife the goal is for comfort care. We are trying to arrange hospice at home. Resume home aspirin. No further work-up as per family request. Palliative consulted. Patient is appropriate for residential hospice.  Palliative met with patient 's wife, decision was to proceed with comfort measure. Patient with minimal oral intake. He qualify for residential hospice. He will be transfer to residential hospice today.   2-agitation: Patient is get very agitated.  Will resume his Depakote.  We will also resume trazodone at bedtime. Continue with haloperidol as needed, Ativan as needed. More calm today. He was abel to sleep last night.   3-A. fib He was not on anticoagulation prior to admission. Goal is for comfort. Resume cartia when BP allows.  Discharge Diagnoses:  Principal Problem:   CVA (cerebral vascular accident) [facility]) Active Problems:   Atrial fibrillation ([place])   HTN (hypertension)   Alzheimer's disease [facility])    Discharge Instructions  Discharge Instructions    Diet - low sodium heart healthy   Complete by:  As directed    Increase activity slowly   Complete by:  As  directed      Allergies as of 08/16/2018   No Known Allergies     Medication List    STOP taking these medications   aspirin 81 MG tablet   CARTIA XT 240 MG 24 hr capsule Generic drug:  diltiazem   CENTRUM SILVER PO   donepezil 10 MG tablet Commonly known as:  [name]   lisinopril 40 MG tablet Commonly known as:  PRINIVIL,ZESTRIL   memantine 28 MG Cp24 24 hr capsule Commonly known as:  [name] XR     TAKE these medications   divalproex 250 MG 24 hr tablet Commonly known as:  DEPAKOTE ER Take 1 tablet (250 mg total) by mouth daily.   glycopyrrolate 1 MG tablet Commonly known as:  ROBINUL Take 1 tablet (1 mg total) by mouth every 4 (four) hours as needed (excessive secretions).   haloperidol 0.5 MG tablet Commonly known as:  HALDOL Take 1 tablet (0.5 mg total) by mouth every 4 (four) hours as needed for agitation (or delirium).   morphine CONCENTRATE 10 MG/0.5ML Soln concentrated solution Take 0.25-0.5 mLs (5-10 mg total) by mouth every 2 (two) hours as needed for moderate pain, severe pain or shortness of breath.   traZODone 50 MG tablet Commonly known as:  DESYREL TAKE 1 TABLET BY MOUTH AT BEDTIME       No Known Allergies  Consultations:  palliative   Procedures/Studies: Dg Chest 2 View  Result Date: 08/13/2018 CLINICAL DATA:  Fall 2 days ago. Hypertension. Dementia. Former smoker. EXAM: CHEST - 2 VIEW COMPARISON:  05/31/2014 FINDINGS: Cardiac silhouette is top-normal in size. No mediastinal or hilar masses.  No evidence of adenopathy. Prominent bronchovascular markings in the bases. Lungs otherwise clear. No pleural effusion or pneumothorax. Skeletal structures are intact. IMPRESSION: No active cardiopulmonary disease. Electronically Signed   By: Lajean Manes M.D.   On: 08/13/2018 10:30   Dg Pelvis 1-2 Views  Result Date: 08/13/2018 CLINICAL DATA:  Fall 2 days ago.  Pain. EXAM: PELVIS - 1-2 VIEW COMPARISON:  06/01/2014 FINDINGS: No fracture or bone  lesion. Hip joints, SI joints and symphysis pubis are normally aligned. Soft tissues are unremarkable. IMPRESSION: No fracture or dislocation Electronically Signed   By: Lajean Manes M.D.   On: 08/13/2018 10:31   Ct Head Wo Contrast  Result Date: 08/13/2018 CLINICAL DATA:  Altered mental status. EXAM: CT HEAD WITHOUT CONTRAST TECHNIQUE: Contiguous axial images were obtained from the base of the skull through the vertex without intravenous contrast. COMPARISON:  Brain [name], 07/13/2013 FINDINGS: Brain: There is focal hypoattenuation in the left medial occipital lobe, extending to the posterior inferior left parietal lobe, consistent with a recent left PCA distribution infarct. This involves the white matter and overlying cortical ribbon. There is no other evidence of a recent infarct old lacunar infarct is noted in the right thalamus. There is ventricular and sulcal enlargement reflecting moderate atrophy, increased when compared to the prior brain [name]. There are no parenchymal masses. There is no midline shift. There are no extra-axial masses or abnormal fluid collections. There is no intracranial hemorrhage. Vascular: No hyperdense vessel or unexpected calcification. Skull: Normal. Negative for fracture or focal lesion. Sinuses/Orbits: Visualize globes and orbits are unremarkable. The visualized sinuses and mastoid air cells are clear. Other: None. IMPRESSION: 1. Recent, likely early subacute, left PCA distribution infarct. 2. No other acute or recent abnormalities. No intracranial hemorrhage. 3. Moderate atrophy.  Old right thalamic lacunar infarct. Electronically Signed   By: Lajean Manes M.D.   On: 08/13/2018 11:10    Subjective: Lethargic. Minimal oral intake  Discharge Exam: Vitals:   08/15/18 2034 08/16/18 0808  BP: 122/79 124/85  Pulse: 68 74  Resp: 16 16  Temp: 97.7 F (36.5 C) (!) 97.3 F (36.3 C)  SpO2: 94% 95%     General; lethargic, resting comfortable.  Cardiovascular: RRR,  S1/S2 +, no rubs, no gallops Respiratory: CTA bilaterally, no wheezing, no rhonchi Abdominal: Soft, NT, ND, bowel sounds + Extremities: no edema, no cyanosis    The results of significant diagnostics from this hospitalization (including imaging, microbiology, ancillary and laboratory) are listed below for reference.     Microbiology: No results found for this or any previous visit (from the past 240 hour(s)).   Labs: BNP (last 3 results) No results for input(s): BNP in the last 8760 hours. Basic Metabolic Panel: Recent Labs  Lab 08/13/18 0956  NA 142  K 4.1  CL 104  CO2 26  GLUCOSE 100*  BUN 10  CREATININE 1.05  CALCIUM 9.3   Liver Function Tests: Recent Labs  Lab 08/13/18 0956  AST 27  ALT 19  ALKPHOS 102  BILITOT 0.7  PROT 7.0  ALBUMIN 3.7   No results for input(s): LIPASE, AMYLASE in the last 168 hours. No results for input(s): AMMONIA in the last 168 hours. CBC: Recent Labs  Lab 08/13/18 0956  WBC 11.7*  NEUTROABS 7.6  HGB 15.4  HCT 47.8  MCV 100.0  PLT 224   Cardiac Enzymes: No results for input(s): CKTOTAL, CKMB, CKMBINDEX, TROPONINI in the last 168 hours. BNP: Invalid input(s): POCBNP CBG: No results for input(s):  GLUCAP in the last 168 hours. D-Dimer No results for input(s): DDIMER in the last 72 hours. Hgb A1c No results for input(s): HGBA1C in the last 72 hours. Lipid Profile No results for input(s): CHOL, HDL, LDLCALC, TRIG, CHOLHDL, LDLDIRECT in the last 72 hours. Thyroid function studies No results for input(s): TSH, T4TOTAL, T3FREE, THYROIDAB in the last 72 hours.  Invalid input(s): FREET3 Anemia work up No results for input(s): VITAMINB12, FOLATE, FERRITIN, TIBC, IRON, RETICCTPCT in the last 72 hours. Urinalysis    Component Value Date/Time   COLORURINE STRAW (A) 08/13/2018 1250   APPEARANCEUR CLEAR 08/13/2018 1250   LABSPEC 1.008 08/13/2018 1250   PHURINE 9.0 (H) 08/13/2018 1250   GLUCOSEU NEGATIVE 08/13/2018 1250   HGBUR  NEGATIVE 08/13/2018 1250   BILIRUBINUR NEGATIVE 08/13/2018 1250   KETONESUR NEGATIVE 08/13/2018 1250   PROTEINUR NEGATIVE 08/13/2018 1250   UROBILINOGEN 0.2 05/31/2014 0308   NITRITE NEGATIVE 08/13/2018 1250   LEUKOCYTESUR NEGATIVE 08/13/2018 1250   Sepsis Labs Invalid input(s): PROCALCITONIN,  WBC,  LACTICIDVEN Microbiology No results found for this or any previous visit (from the past 240 hour(s)).   Time coordinating discharge: 40 minutes  SIGNED:   Elmarie Shiley, MD  [facility]

## 2018-08-16 NOTE — Progress Notes (Signed)
Pt d/c to Hospice, report called to [facility] Aggie Cosier, IV Removed, catheter intact, pt left with [facility] via stretcher, AVS given to [facility] EMT. Jorge Ny

## 2018-08-16 NOTE — Progress Notes (Signed)
PALLIATIVE:  Patient remains lethargic, does open eyes this morning when name called. Appears comfortable and in no distress. Wife is at the bedside. She verbalizes he has a few episodes during the night which he seemed uncomfortable, however, they were appropriately managed.   Continues to have poor appetite. No breakfast intake due to lethargy and when she attempted during the time he appeared more awake he only held the food in his mouth and she had to sweep it back out with her finger. Discussed risk of aspiration given he will not swallow food. Wife verbalized understanding.   Wife verbalized patient was going to be transferred today to [facility] and that she was pleased with the decision. Support given.   Assessment: Lethargic, opens eyes intermittently, weakness, scattered bruising, S1S2, decreased breath sounds, decreased bowel sounds, condom cath  Plan: DNR Full Comfort Transferring to [facility] for continued comfort/EOL care  Total Time: 25 min.  Greater than 50%  of this time was spent counseling and coordinating care related to the above assessment and plan  Willette Alma, AGPCNP-BC Palliative Medicine Team  Pager: [phone] Amion: N. [place]

## 2018-08-16 NOTE — Progress Notes (Signed)
Patient will DC to: [facility] Anticipated DC date: 08/16/18 Family notified: Spouse at bedside Transport by: Sharin Mons   Per MD patient ready for DC to [facility]. RN, patient, patient's family, and facility notified of DC. Discharge Summary and FL2 sent to facility. RN to call report prior to discharge [phone]). DC packet on chart. Ambulance transport requested for patient.   CSW will sign off for now as social work intervention is no longer needed. Please consult [place] again if new needs arise.  Cristobal Goldmann, LCSW Clinical Social Worker [phone]

## 2018-08-16 NOTE — Care Management Important Message (Signed)
Important Message  Patient Details  Name: Darren Morgan MRN: [id number] Date of Birth: [birth year]   Medicare Important Message Given:  Yes    Dorena Bodo 08/16/2018, 4:04 PM

## 2018-09-14 DEATH — deceased
